# Patient Record
Sex: Female | Born: 1945 | Race: White | Hispanic: No | Marital: Single | Smoking: Never smoker
Health system: Southern US, Community
[De-identification: ages and names within clinical notes are randomized; demographics above are authoritative.]

## PROBLEM LIST (undated history)

## (undated) DIAGNOSIS — F32A Depression, unspecified: Secondary | ICD-10-CM

## (undated) DIAGNOSIS — IMO0002 Reserved for concepts with insufficient information to code with codable children: Secondary | ICD-10-CM

## (undated) DIAGNOSIS — F419 Anxiety disorder, unspecified: Secondary | ICD-10-CM

## (undated) DIAGNOSIS — M1990 Unspecified osteoarthritis, unspecified site: Secondary | ICD-10-CM

## (undated) DIAGNOSIS — G4700 Insomnia, unspecified: Secondary | ICD-10-CM

## (undated) DIAGNOSIS — C7A8 Other malignant neuroendocrine tumors: Secondary | ICD-10-CM

## (undated) HISTORY — PX: TONSILLECTOMY: SUR1361

## (undated) HISTORY — DX: Reserved for concepts with insufficient information to code with codable children: IMO0002

## (undated) HISTORY — PX: WISDOM TOOTH EXTRACTION: SHX21

## (undated) HISTORY — DX: Insomnia, unspecified: G47.00

## (undated) HISTORY — DX: Other malignant neuroendocrine tumors: C7A.8

## (undated) HISTORY — DX: Unspecified osteoarthritis, unspecified site: M19.90

## (undated) HISTORY — PX: TUBAL LIGATION: SHX77

## (undated) HISTORY — PX: KNEE SURGERY: SHX244

## (undated) HISTORY — PX: APPENDECTOMY: SHX54

---

## 1999-06-21 ENCOUNTER — Other Ambulatory Visit: Admission: RE | Admit: 1999-06-21 | Discharge: 1999-06-21 | Payer: Self-pay | Admitting: Family Medicine

## 2001-12-31 ENCOUNTER — Encounter: Admission: RE | Admit: 2001-12-31 | Discharge: 2001-12-31 | Payer: Self-pay | Admitting: *Deleted

## 2004-04-20 ENCOUNTER — Other Ambulatory Visit: Admission: RE | Admit: 2004-04-20 | Discharge: 2004-04-20 | Payer: Self-pay | Admitting: Family Medicine

## 2004-08-12 ENCOUNTER — Ambulatory Visit (HOSPITAL_COMMUNITY): Admission: RE | Admit: 2004-08-12 | Discharge: 2004-08-12 | Payer: Self-pay | Admitting: Specialist

## 2004-12-06 ENCOUNTER — Ambulatory Visit: Payer: Self-pay | Admitting: Family Medicine

## 2005-02-22 ENCOUNTER — Ambulatory Visit: Payer: Self-pay | Admitting: Family Medicine

## 2005-03-29 ENCOUNTER — Ambulatory Visit: Payer: Self-pay | Admitting: Family Medicine

## 2005-04-18 ENCOUNTER — Ambulatory Visit: Payer: Self-pay | Admitting: Family Medicine

## 2005-04-26 ENCOUNTER — Ambulatory Visit: Payer: Self-pay | Admitting: Family Medicine

## 2005-04-26 ENCOUNTER — Other Ambulatory Visit: Admission: RE | Admit: 2005-04-26 | Discharge: 2005-04-26 | Payer: Self-pay | Admitting: Family Medicine

## 2005-05-17 ENCOUNTER — Ambulatory Visit: Payer: Self-pay | Admitting: Family Medicine

## 2005-05-26 ENCOUNTER — Ambulatory Visit: Payer: Self-pay | Admitting: Gastroenterology

## 2005-06-07 ENCOUNTER — Ambulatory Visit: Payer: Self-pay | Admitting: Family Medicine

## 2005-07-19 ENCOUNTER — Ambulatory Visit: Payer: Self-pay | Admitting: Family Medicine

## 2005-08-31 ENCOUNTER — Ambulatory Visit: Payer: Self-pay | Admitting: Family Medicine

## 2005-12-13 ENCOUNTER — Ambulatory Visit: Payer: Self-pay | Admitting: Family Medicine

## 2006-02-01 ENCOUNTER — Ambulatory Visit: Payer: Self-pay | Admitting: Family Medicine

## 2006-03-13 ENCOUNTER — Ambulatory Visit: Payer: Self-pay | Admitting: Family Medicine

## 2006-05-30 ENCOUNTER — Ambulatory Visit: Payer: Self-pay | Admitting: Family Medicine

## 2006-07-23 ENCOUNTER — Ambulatory Visit: Payer: Self-pay | Admitting: Family Medicine

## 2006-09-25 ENCOUNTER — Ambulatory Visit: Payer: Self-pay | Admitting: Family Medicine

## 2007-01-29 ENCOUNTER — Encounter: Admission: RE | Admit: 2007-01-29 | Discharge: 2007-01-29 | Payer: Self-pay | Admitting: Family Medicine

## 2008-03-23 IMAGING — US US EXTREM LOW VENOUS*R*
1 series · 13 of 24 positions shown · non-contrast
Comparison: none

CLINICAL DATA: 61 year old female with right thigh and calf varicose veins, leg pain. 
RIGHT LOWER EXTREMITY DUPLEX VENOUS ULTRASOUND OF THE SUPERFICIAL AND DEEP VENOUS SYSTEMS:
TECHNIQUE: Gray-scale sonography with compression, as well as color and duplex Doppler ultrasound, were performed to evaluate the deep venous system from the level of the common femoral vein through the popliteal and proximal calf veins.

[Series 1: us extrem low venous*right* · 13 of 25 slices shown]
[im 1/25]
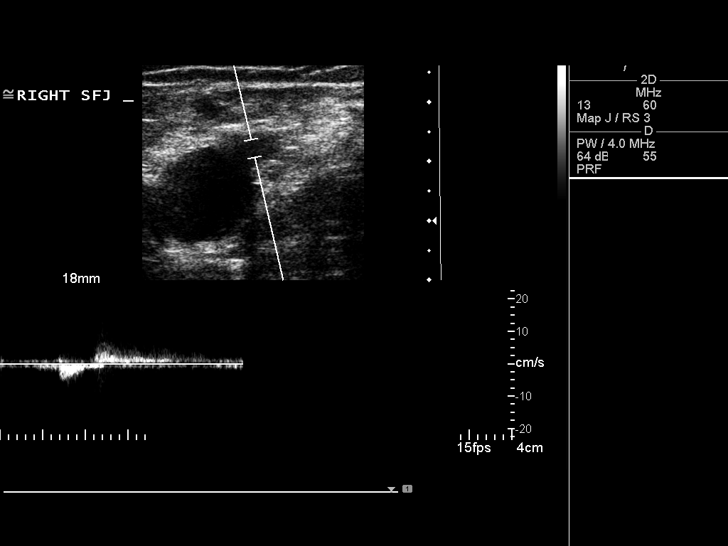
[im 3/25]
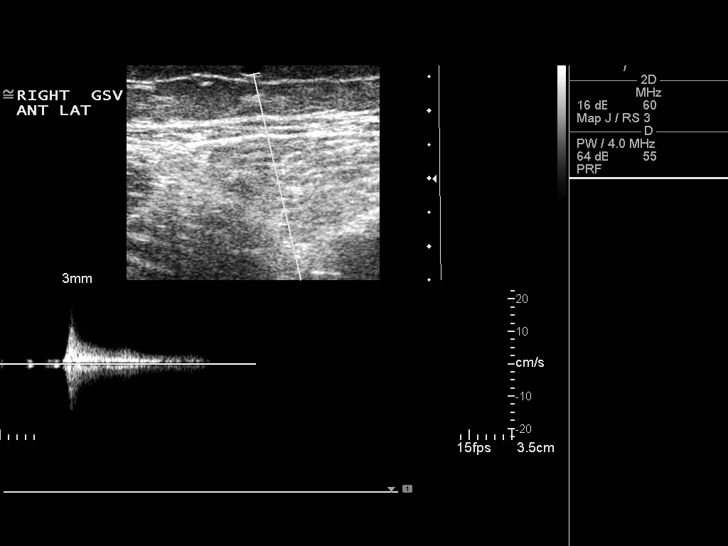
[im 5/25]
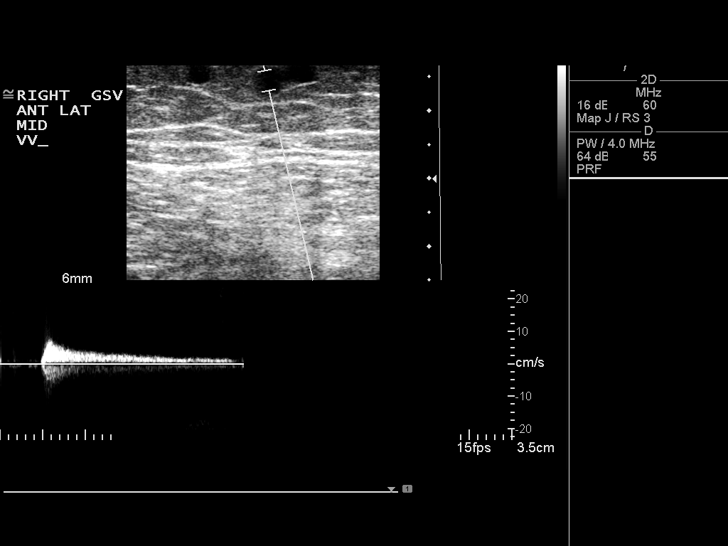
[im 7/25]
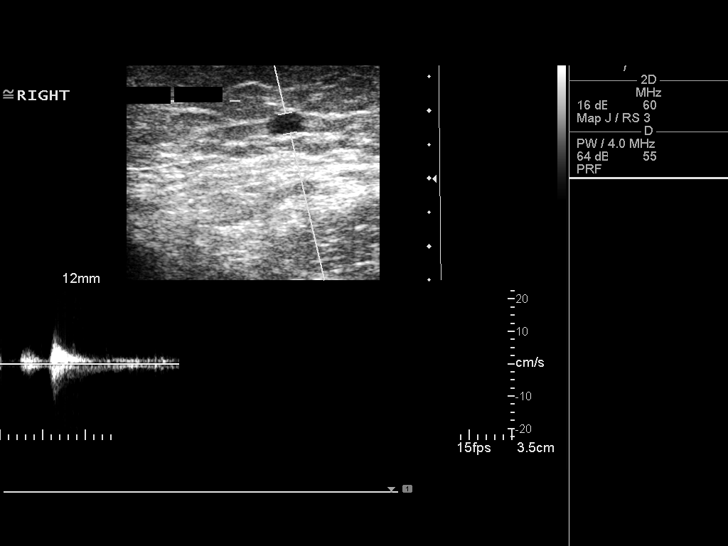
[im 9/25]
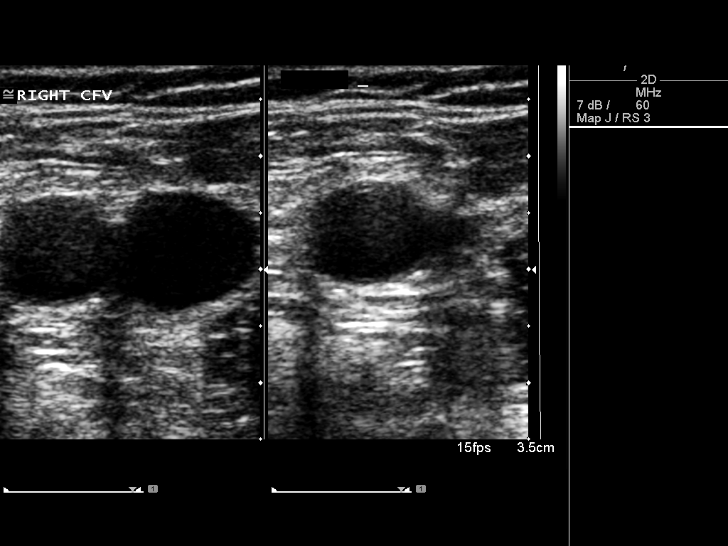
[im 11/25]
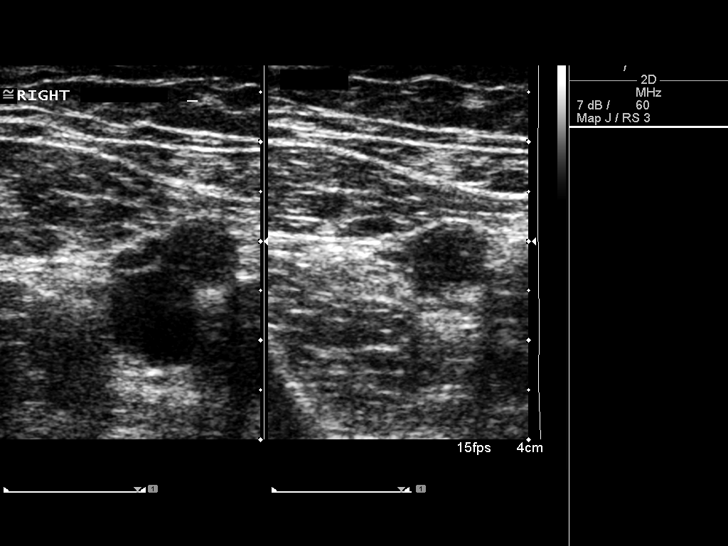
[im 13/25]
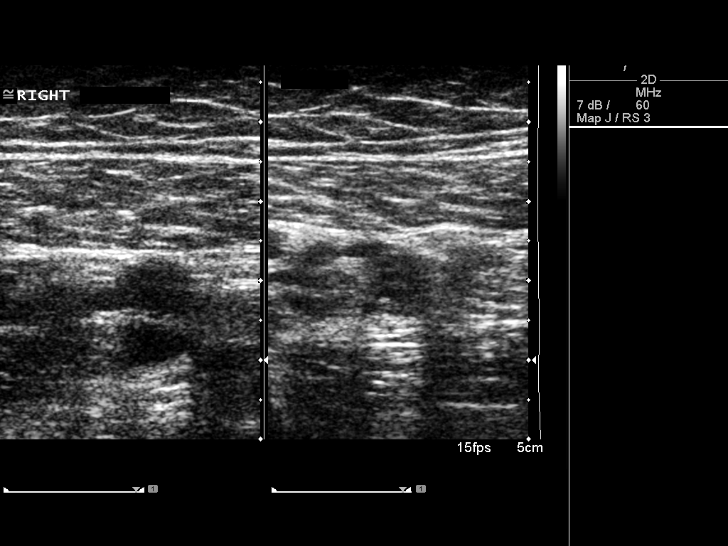
[im 14/25]
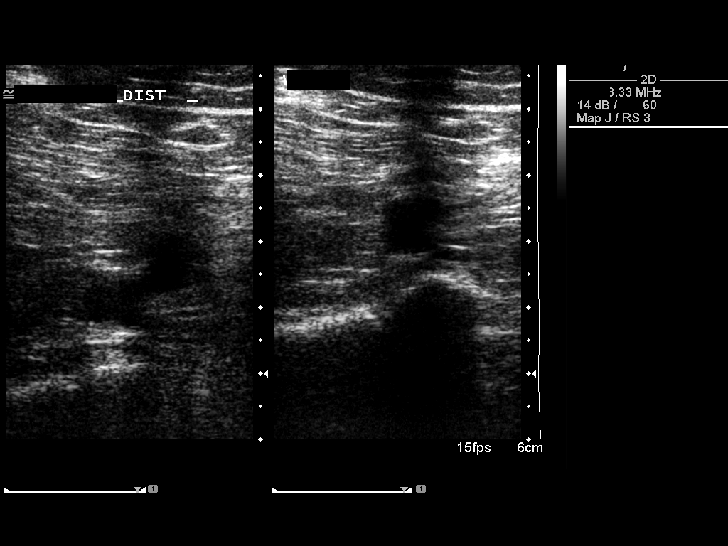
[im 16/25]
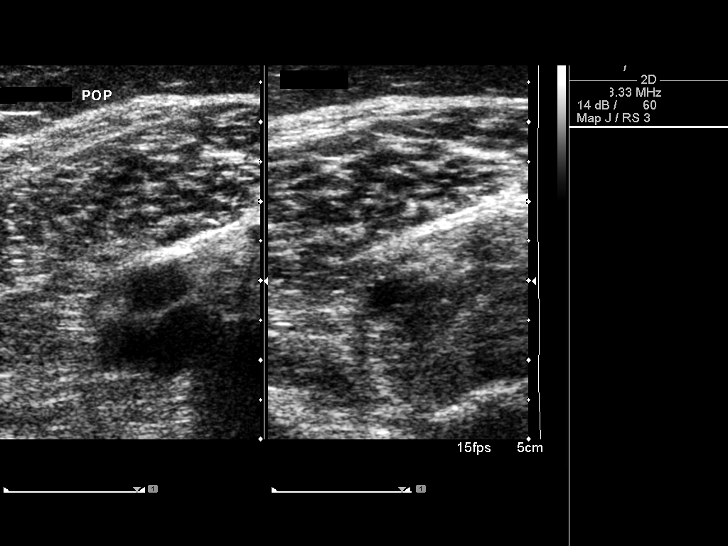
[im 18/25]
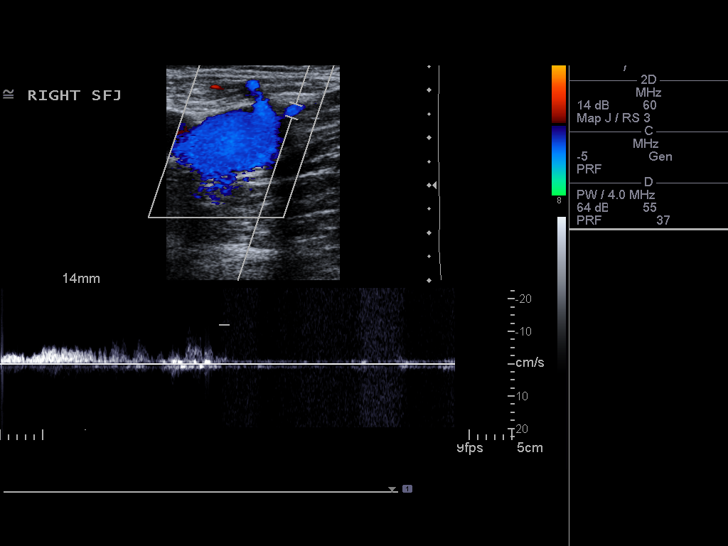
[im 20/25]
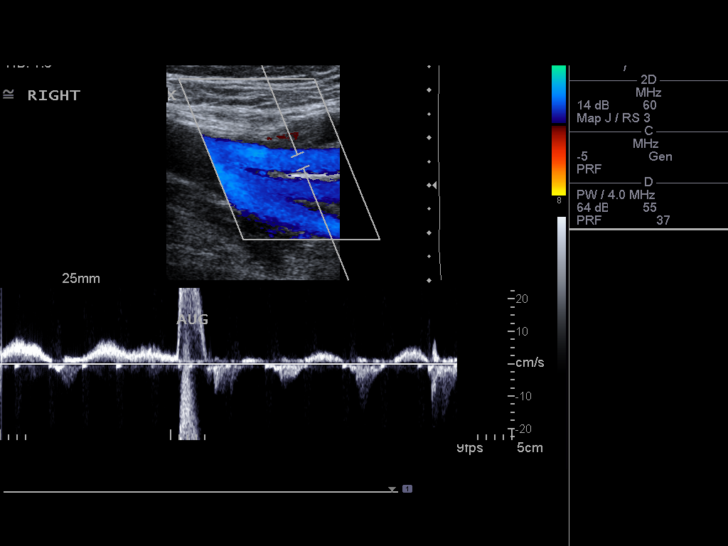
[im 22/25]
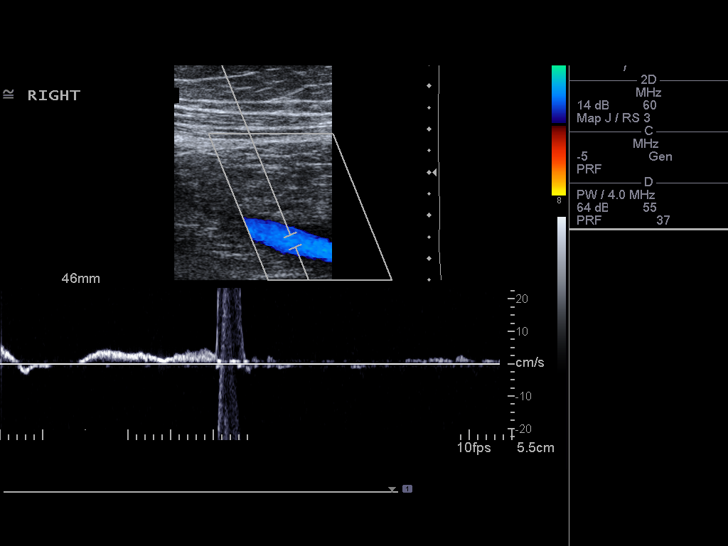
[im 25/25]
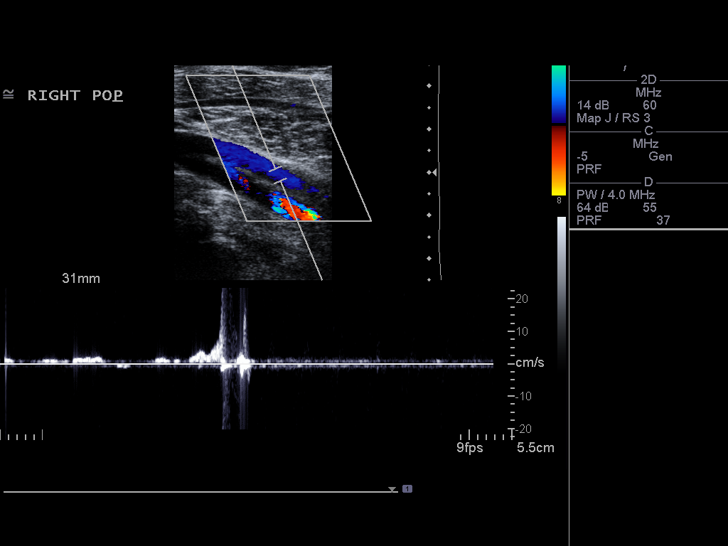

[13 of 24 positions shown; findings below may reference images not displayed]

FINDINGS: The right GSV is duplicated proximally with an anterior lateral tributary branch.  This anterior lateral GSV is dilated with venous insufficiency in the proximal thigh for approximately 15cm.  In the mid thigh there is a dominant thigh varicosity supplied by the anterior lateral GSV which is very tortuous and eventually communicates with the right main GSV distally above the knee.  GSV venous insufficiency is noted across the knee with decompression into the calf varicosities.  This is the patient?s dominant refluxing pathway and results in the thigh and calf varicose veins.  The right common femoral, femoral, and popliteal veins demonstrate normal compressibility, phasicity, and augmentation without DVT.
IMPRESSION: 1.  Right anterior lateral GSV and main GSV venous insufficiency resulting in the thigh and calf varicosities.  The pattern of disease is amenable to endovenous laser treatment. 
2.  No evidence of DVT.

## 2008-06-12 ENCOUNTER — Ambulatory Visit: Payer: Self-pay | Admitting: Family Medicine

## 2008-06-12 DIAGNOSIS — G4700 Insomnia, unspecified: Secondary | ICD-10-CM

## 2008-06-12 DIAGNOSIS — M1990 Unspecified osteoarthritis, unspecified site: Secondary | ICD-10-CM | POA: Insufficient documentation

## 2009-01-28 ENCOUNTER — Ambulatory Visit: Payer: Self-pay | Admitting: Family Medicine

## 2009-01-28 DIAGNOSIS — J309 Allergic rhinitis, unspecified: Secondary | ICD-10-CM | POA: Insufficient documentation

## 2009-10-21 ENCOUNTER — Ambulatory Visit: Payer: Self-pay | Admitting: Family Medicine

## 2010-03-24 ENCOUNTER — Ambulatory Visit: Payer: Self-pay | Admitting: Family Medicine

## 2010-03-24 DIAGNOSIS — N959 Unspecified menopausal and perimenopausal disorder: Secondary | ICD-10-CM | POA: Insufficient documentation

## 2010-03-29 ENCOUNTER — Encounter: Payer: Self-pay | Admitting: Family Medicine

## 2010-03-30 ENCOUNTER — Encounter: Payer: Self-pay | Admitting: Family Medicine

## 2010-04-04 LAB — CONVERTED CEMR LAB: FSH: 50.3 milliintl units/mL

## 2010-04-05 ENCOUNTER — Telehealth: Payer: Self-pay | Admitting: Family Medicine

## 2010-04-27 ENCOUNTER — Ambulatory Visit: Payer: Self-pay | Admitting: Family Medicine

## 2010-04-27 DIAGNOSIS — R5383 Other fatigue: Secondary | ICD-10-CM

## 2010-04-27 DIAGNOSIS — F411 Generalized anxiety disorder: Secondary | ICD-10-CM | POA: Insufficient documentation

## 2010-04-27 DIAGNOSIS — R5381 Other malaise: Secondary | ICD-10-CM | POA: Insufficient documentation

## 2010-04-27 DIAGNOSIS — R635 Abnormal weight gain: Secondary | ICD-10-CM | POA: Insufficient documentation

## 2010-04-28 ENCOUNTER — Ambulatory Visit: Payer: Self-pay | Admitting: Family Medicine

## 2010-05-02 LAB — CONVERTED CEMR LAB
BUN: 20 mg/dL (ref 6–23)
Basophils Absolute: 0 10*3/uL (ref 0.0–0.1)
Basophils Relative: 0.8 % (ref 0.0–3.0)
CO2: 28 meq/L (ref 19–32)
Calcium: 9.1 mg/dL (ref 8.4–10.5)
Chloride: 106 meq/L (ref 96–112)
Cholesterol: 206 mg/dL — ABNORMAL HIGH (ref 0–200)
Creatinine, Ser: 0.8 mg/dL (ref 0.4–1.2)
Direct LDL: 122.6 mg/dL
Eosinophils Absolute: 0.1 10*3/uL (ref 0.0–0.7)
Eosinophils Relative: 1.5 % (ref 0.0–5.0)
GFR calc non Af Amer: 72.95 mL/min (ref >60)
Glucose, Bld: 93 mg/dL (ref 70–99)
HCT: 41.3 % (ref 36.0–46.0)
HDL: 64.6 mg/dL (ref >39.00)
Hemoglobin: 14.2 g/dL (ref 12.0–15.0)
Lymphocytes Relative: 27.6 % (ref 12.0–46.0)
Lymphs Abs: 1.6 10*3/uL (ref 0.7–4.0)
MCHC: 34.4 g/dL (ref 30.0–36.0)
MCV: 95.7 fL (ref 78.0–100.0)
Monocytes Absolute: 0.4 10*3/uL (ref 0.1–1.0)
Monocytes Relative: 6.5 % (ref 3.0–12.0)
Neutro Abs: 3.7 10*3/uL (ref 1.4–7.7)
Neutrophils Relative %: 63.6 % (ref 43.0–77.0)
Platelets: 159 10*3/uL (ref 150.0–400.0)
Potassium: 5.3 meq/L — ABNORMAL HIGH (ref 3.5–5.1)
RBC: 4.31 M/uL (ref 3.87–5.11)
RDW: 13.5 % (ref 11.5–14.6)
Sodium: 140 meq/L (ref 135–145)
TSH: 2.49 microintl units/mL (ref 0.35–5.50)
Total CHOL/HDL Ratio: 3
Triglycerides: 101 mg/dL (ref 0.0–149.0)
VLDL: 20.2 mg/dL (ref 0.0–40.0)
WBC: 5.9 10*3/uL (ref 4.5–10.5)

## 2010-05-03 ENCOUNTER — Telehealth: Payer: Self-pay | Admitting: Family Medicine

## 2010-06-16 ENCOUNTER — Ambulatory Visit: Payer: Self-pay | Admitting: Family Medicine

## 2010-10-18 ENCOUNTER — Ambulatory Visit: Payer: Self-pay | Admitting: Internal Medicine

## 2010-11-29 NOTE — Progress Notes (Signed)
Summary: ?alprazolam   Phone Note Call from Patient   Caller: Patient Summary of Call: took 1/2 alprazolam and slept all day thinks may need something  else pls advise. Initial call taken by: Pura Spice, RN,  May 03, 2010 9:45 AM  Follow-up for Phone Call        per dr York Spaniel cut alpzolasm tablets into fourths and try justtaking quarterv of tablet .  Follow-up by: Pura Spice, RN,  May 03, 2010 1:56 PM  Additional Follow-up for Phone Call Additional follow up Details #1::        pt notified.  Additional Follow-up by: Pura Spice, RN,  May 03, 2010 2:29 PM

## 2010-11-29 NOTE — Medication Information (Signed)
Summary: Coverage Approval for [employer]  Coverage Approval for [employer]   Imported By: Maryln Gottron 04/14/2010 14:11:39  _____________________________________________________________________  External Attachment:    Type:   Image     Comment:   External Document

## 2010-11-29 NOTE — Assessment & Plan Note (Signed)
Summary: med check/discuss menopause med/cjr   Vital Signs:  Patient profile:   65 year old female Weight:      178 pounds BMI:     26.38 Temp:     98.6 degrees F Pulse rate:   67 / minute Pulse rhythm:   regular BP sitting:   124 / 72  (left arm)  Vitals Entered By: Pura Spice, RN (April 27, 2010 2:57 PM) CC: stressed  hot flashes better.    History of Present Illness: This 65 year old single white female postmenopausal who is been having hot flashes but since started on estradiol has been doing much better he continues to have occasional hot flash. Her main complaint is she had been first rest and dislikes her job and his own and take an alprazolam at bedtime to discuss taking it 3 times a day a lower dosage She complains of fatigue and also weight gain normal unable to lose We have discussed having lab studies to further evaluate her 3 joints are well controlled with Arthrotec  Allergies: 1)  ! Penicillin G Sodium (Penicillin G Sodium) 2)  ! Compazine  Past History:  Past Medical History: Last updated: 06/12/2008 Osteoarthritis degenerative disc disease insomnia  Review of Systems      See HPI  The patient denies anorexia, fever, weight loss, weight gain, vision loss, decreased hearing, hoarseness, chest pain, syncope, dyspnea on exertion, peripheral edema, prolonged cough, headaches, hemoptysis, abdominal pain, melena, hematochezia, severe indigestion/heartburn, hematuria, incontinence, genital sores, muscle weakness, suspicious skin lesions, transient blindness, difficulty walking, depression, unusual weight change, abnormal bleeding, enlarged lymph nodes, angioedema, breast masses, and testicular masses.    Physical Exam  General:  Well-developed,well-nourished,in no acute distress; alert,appropriate and cooperative throughout examination Head:  Normocephalic and atraumatic without obvious abnormalities. No apparent alopecia or balding. Eyes:  No corneal or  conjunctival inflammation noted. EOMI. Perrla. Funduscopic exam benign, without hemorrhages, exudates or papilledema. Vision grossly normal. Ears:  External ear exam shows no significant lesions or deformities.  Otoscopic examination reveals clear canals, tympanic membranes are intact bilaterally without bulging, retraction, inflammation or discharge. Hearing is grossly normal bilaterally. Nose:  External nasal examination shows no deformity or inflammation. Nasal mucosa are pink and moist without lesions or exudates. Mouth:  Oral mucosa and oropharynx without lesions or exudates.  Teeth in good repair. Lungs:  Normal respiratory effort, chest expands symmetrically. Lungs are clear to auscultation, no crackles or wheezes. Heart:  Normal rate and regular rhythm. S1 and S2 normal without gallop, murmur, click, rub or other extra sounds. Abdomen:  Bowel sounds positive,abdomen soft and non-tender without masses, organomegaly or hernias noted. Rectal:  not examined Genitalia:  none exam Msk:  No deformity or scoliosis noted of thoracic or lumbar spine.   Extremities:  No clubbing, cyanosis, edema, or deformity noted with normal full range of motion of all joints.     Impression & Recommendations:  Problem # 1:  ANXIETY (ICD-300.00) Assessment New  The following medications were removed from the medication list:    Alprazolam 1 Mg Tabs (Alprazolam) .Marland Kitchen... 1 hs as needed sleep, increase to 2 prn Her updated medication list for this problem includes:    Alprazolam 1 Mg Tabs (Alprazolam) .Marland Kitchen... 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 1qd for menopausal syndrome  Problem # 5:  INSOMNIA (ICD-780.52) Assessment: Improved  The following medications were removed from the medication list:    Lunesta 3 Mg Tabs (Eszopiclone) .Marland Kitchen... 1 hs for sleep alprazolam 2 mg at bedtime has helped  Problem # 6:  OSTEOARTHRITIS (ICD-715.90) Assessment: Improved  Her updated medication list for this problem includes:    Arthrotec 75 75-200 Mg-mcg Tabs (Diclofenac-misoprostol) .Marland Kitchen... 1 two times a day for inflammation  Complete Medication List: 1)  Arthrotec 75 75-200 Mg-mcg Tabs (Diclofenac-misoprostol) .Marland Kitchen.. 1 two times a day for inflammation 2)  Valtrex 1 Gm Tabs (Valacyclovir hcl) .... 2 tab stat then repeat in 12 hrs then repeat in 2 weeks 3)  Estradiol 0.5 Mg Tabs (Estradiol) .Marland Kitchen.. 1qd for menopausal syndrome 4)  Alprazolam 1 Mg Tabs (Alprazolam) .Marland Kitchen.. 1 morn midafternoon and hs for anxiety and depression  Patient Instructions: 1)  anxiety and depression and fatique 2)  take alprazolam three times a day 3)  Schedule Labs on 30 June Prescriptions: ALPRAZOLAM 1 MG TABS (ALPRAZOLAM) 1 morn midafternoon and hs for anxiety and depression  # 90 x 5   Entered and Authorized by:   Judithann Sheen MD   Signed by:   Judithann Sheen MD on 04/27/2010   Method used:   Print then Give to Patient   RxID:   [id number]

## 2010-11-29 NOTE — Assessment & Plan Note (Signed)
Summary: CONSULT ABOUT ARTHRITIS // RS   Vital Signs:  Patient profile:   65 year old female Height:      69 inches Weight:      175 pounds O2 Sat:      97 % Temp:     98.5 degrees F Pulse rate:   72 / minute BP sitting:   130 / 84  (left arm)  Vitals Entered By: Pura Spice, RN (Mar 24, 2010 4:13 PM) CC: not sleeping lunesta not helping    History of Present Illness: This 65 year old white single female is in today complaining of inability to sleep, as well as having hot flashes haven't had her last menstruation several years ago and is not on any hormone Complaint of being depressed and not able to lose weight She works out 3-4 times per week for 30-40 minutes and deferred muscular and we have discussed that 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 Penicillin G Sodium (Penicillin G Sodium) 2)  ! Compazine  Past History:  Past Medical History: Last updated: 06/12/2008 Osteoarthritis degenerative disc disease insomnia  Review of Systems      See HPI  The patient denies anorexia, fever, weight loss, weight gain, vision loss, decreased hearing, hoarseness, chest pain, syncope, dyspnea on exertion, peripheral edema, prolonged cough, headaches, hemoptysis, abdominal pain, melena, hematochezia, severe indigestion/heartburn, hematuria, incontinence, genital sores, muscle weakness, suspicious skin lesions, transient blindness, difficulty walking, depression, unusual weight change, abnormal bleeding, enlarged lymph nodes, angioedema, breast masses, and testicular masses.    Physical Exam  General:  Well-developed,well-nourished,in no acute distress; alert,appropriate and cooperative throughout examination Lungs:  Normal respiratory effort, chest expands  symmetrically. Lungs are clear to auscultation, no crackles or wheezes. Heart:  Normal rate and regular rhythm. S1 and S2 normal without gallop, murmur, click, rub or other extra sounds. Abdomen:  Bowel sounds positive,abdomen soft and non-tender without masses, organomegaly or hernias noted. Rectal:  not examined Genitalia:  not examined Msk:  No deformity or scoliosis noted of thoracic or lumbar spine.   Extremities:  No clubbing, cyanosis, edema, or deformity noted with normal full range of motion of all joints.     Impression & Recommendations:  Problem # 1:  POSTMENOPAUSAL SYNDROME (ICD-627.9) Assessment New  Orders: TLB-FSH (Follicle Stimulating Hormone) (83001-FSH) after getting FSH results will discuss treatment  Problem # 2:  INSOMNIA (ICD-780.52) Assessment: Unchanged  Her updated medication list for this problem includes:    Lunesta 3 Mg Tabs (Eszopiclone) .Marland Kitchen... 1 hs for sleep  Problem # 3:  OSTEOARTHRITIS (ICD-715.90) Assessment: Deteriorated  The following medications were removed from the medication list:    Tramadol Hcl 50 Mg Tabs (Tramadol hcl) .Marland Kitchen... 1-2 q4h as needed cough or pain Her updated medication list for this problem includes:    Arthrotec 75 75-200 Mg-mcg Tabs (Diclofenac-misoprostol) .Marland Kitchen... 1 two times a day for inflammation  Complete Medication List: 1)  Lunesta 3 Mg Tabs (Eszopiclone) .Marland Kitchen.. 1 hs for sleep 2)  Arthrotec 75 75-200 Mg-mcg Tabs (Diclofenac-misoprostol) .Marland Kitchen.. 1 two times a day for inflammation 3)  Valtrex 1 Gm Tabs (Valacyclovir hcl) .... 2 tab stat then repeat in 12 hrs then repeat in 2 weeks 4)  Maxiflu Dm 60-20-400-500 Mg Tabs (Pseudoephedrine-dm-gg-apap) .Marland Kitchen.. 1 two times  a day for congestion and drainage 5)  Alprazolam 1 Mg Tabs (Alprazolam) .Marland Kitchen.. 1 hs as needed sleep, increase to 2 prn  Patient Instructions: 1)  continue taking Arthrotec for arthritis and also prescribed tramadol that she can utilize for pain 2)  After we receive the  [facility] report we will discuss postmenopausal treatment 3)  I don't think you have a problem with being overweight ovary muscular due to your regular exercise program 4)  Lunesta for your insomnia 5)  Return your regular time for physical examination Prescriptions: TRAMADOL HCL 50 MG TABS (TRAMADOL HCL) 1-2 q4h as needed cough or pain  #50 x 1   Entered and Authorized by:   Judithann Sheen MD   Signed by:   Judithann Sheen MD on 03/24/2010   Method used:   Electronically to        [facility] [phone]* (retail)       [address]       [place], [place]  [address]       Ph: [phone] or [phone]       Fax: [phone]   RxID:   [id number] LEVAQUIN 750 MG TABS (LEVOFLOXACIN) 1 once daily until finished for infection  #7 x 0   Entered and Authorized by:   Judithann Sheen MD   Signed by:   Judithann Sheen MD on 03/24/2010   Method used:   [facility]ronically to        [id number] (retail[facility]       [address]       [place], [place]  [address]       Ph: [phone] or [phone]       Fax: [phone]   RxID:   [id number] PROCHLORPERAZINE MALEATE 10 MG TABS (PROCHLORPERAZINE MALEATE) 1 q6h as needed to prevent nausea and vomiting  #36 x 2   Entered and Authorized by:   Judithann Sheen MD   Signed by:   Judithann Sheen MD on 03/24/2010   Method used:   Print then Give to Patient   RxID:   [id number] LUNESTA 3 MG TABS (ESZOPICLONE) 1 hs for sleep  #30 x 5   Entered and Authorized by:   Judithann Sheen MD   Signed by:   Judithann Sheen MD on 03/24/2010   Method used:   Print then Give to Patient   RxID:   [id number] ALPRAZOLAM 1 MG TABS (ALPRAZOLAM) 1 hs as needed sleep, increase to 2 prn  #60 x 5   Entered and Authorized by:   Judithann Sheen MD   Signed by:   Judithann Sheen MD on 03/24/2010   Method used:   Print then Give to Patient   RxID:   [id number]

## 2010-11-29 NOTE — Progress Notes (Signed)
Summary: Pt req lab results. Pls call asap  Phone Note Call from Patient Call back at Home Phone [phone]   Caller: Patient Summary of Call: Pt called req lab results from 03/24/10. Pls call asap.  Initial call taken by: Lucy Antigua,  April 05, 2010 8:49 AM  Follow-up for Phone Call        menopausal syndrome, estradiol .5 mg qd Follow-up by: Judithann Sheen MD,  April 05, 2010 5:50 PM

## 2010-11-29 NOTE — Assessment & Plan Note (Signed)
Summary: fup on meds/ccm pt rsc/njr   Vital Signs:  Patient profile:   65 year old female Weight:      174 pounds O2 Sat:      98 % Temp:     98.2 degrees F Pulse rhythm:   regular BP sitting:   124 / 78  (left arm)  Vitals Entered By: Pura Spice, RN (June 16, 2010 1:03 PM) CC: alprazolam did not help and she stopped arthrotec.    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 Penicillin G Sodium (Penicillin G Sodium) 2)  ! Compazine  Past History:  Past Medical History: Last updated: 06/12/2008 Osteoarthritis degenerative disc disease insomnia  Review of Systems      See HPI  The patient denies anorexia, fever, weight loss, weight gain, vision loss, decreased hearing, hoarseness, chest pain, syncope, dyspnea on exertion, peripheral edema, prolonged cough, headaches, hemoptysis, abdominal pain, melena, hematochezia, severe indigestion/heartburn, hematuria, incontinence, genital sores, muscle weakness, suspicious skin lesions, transient blindness, difficulty walking, depression, unusual weight change, abnormal bleeding, enlarged lymph nodes, angioedema, breast masses, and testicular masses.    Physical Exam  General:  Well-developed,well-nourished,in no acute distress; alert,appropriate and cooperative throughout examination Lungs:  Normal respiratory effort, chest expands symmetrically. Lungs are clear to auscultation, no crackles or wheezes. Heart:  Normal rate and regular rhythm. S1 and S2 normal without gallop, murmur, click, rub or other extra sounds. Msk:  PIP joints are tender and slightly swollen Extremities:  No clubbing, cyanosis,  edema, or deformity noted with normal full range of motion of all joints.     Impression & Recommendations:  Problem # 1:  ANXIETY (ICD-300.00) Assessment Deteriorated  The following medications were removed from the medication list:    Alprazolam 1 Mg Tabs (Alprazolam) .Marland Kitchen... 1 morn midafternoon and hs for anxiety and depression Her updated medication list for this problem includes:    Diazepam 5 Mg Tabs (Diazepam) .Marland Kitchen... 1 morn midafternoon and hs for stress and anxiety  Problem # 2:  POSTMENOPAUSAL SYNDROME (ICD-627.9) Assessment: Improved  Her updated medication list for this problem includes:    Estradiol 0.5 Mg Tabs (Estradiol) .Marland Kitchen... 1qd for menopausal syndrome  Problem # 3:  Preventive Health Care (ICD-V70.0) Assessment: Deteriorated restart Arthrotec one b.i.d. p.c.  Complete Medication List: 1)  Arthrotec 75 75-200 Mg-mcg Tabs (Diclofenac-misoprostol) .Marland Kitchen.. 1 two times a day for inflammation 2)  Valtrex 1 Gm Tabs (Valacyclovir hcl) .... 2 tab stat then repeat in 12 hrs then repeat in 2 weeks 3)  Estradiol 0.5 Mg Tabs (Estradiol) .Marland Kitchen.. 1qd for menopausal syndrome 4)  Diazepam 5 Mg Tabs (Diazepam) .Marland Kitchen.. 1 morn midafternoon and hs for stress and anxiety  Patient Instructions: 1)  would change alprazolam to diazepam 5 mg t.i.d. 2)  Restart Arthrotec 3)  Continue estradiol Prescriptions: DIAZEPAM 5 MG TABS (DIAZEPAM) 1 morn midafternoon and hs for stress and anxiety  #90 x 5   Entered and Authorized by:   Judithann Sheen MD   Signed by:   Judithann Sheen MD on 06/16/2010   Method used:   Print then Give to Patient   RxID:   [id number]

## 2010-11-29 NOTE — Medication Information (Signed)
Summary: Coverage Approval for [employer]  Coverage Approval for [employer]   Imported By: Maryln Gottron 04/01/2010 09:58:08  _____________________________________________________________________  External Attachment:    Type:   Image     Comment:   External Document

## 2011-04-18 ENCOUNTER — Encounter: Payer: Self-pay | Admitting: Family Medicine

## 2011-04-18 ENCOUNTER — Ambulatory Visit (INDEPENDENT_AMBULATORY_CARE_PROVIDER_SITE_OTHER): Payer: BC Managed Care – PPO | Admitting: Family Medicine

## 2011-04-18 DIAGNOSIS — G4700 Insomnia, unspecified: Secondary | ICD-10-CM

## 2011-04-18 DIAGNOSIS — J309 Allergic rhinitis, unspecified: Secondary | ICD-10-CM

## 2011-04-18 DIAGNOSIS — B009 Herpesviral infection, unspecified: Secondary | ICD-10-CM

## 2011-04-18 DIAGNOSIS — Z780 Asymptomatic menopausal state: Secondary | ICD-10-CM

## 2011-04-18 DIAGNOSIS — N951 Menopausal and female climacteric states: Secondary | ICD-10-CM

## 2011-04-18 MED ORDER — VALACYCLOVIR HCL 1 G PO TABS: ORAL_TABLET | ORAL | Status: AC

## 2011-04-18 MED ORDER — BECLOMETHASONE DIPROPIONATE 80 MCG/ACT NA AERS: 2.0000 | INHALATION_SPRAY | NASAL | Status: DC

## 2011-04-18 MED ORDER — PREDNISONE 10 MG PO TABS: ORAL_TABLET | ORAL | Status: DC

## 2011-04-18 NOTE — Patient Instructions (Signed)
Allergic  rhinitis, use qnasl 2 sprays in each nostril each day Take prednisone as prescribed, repeat if reoccurs Schedule physical  examination

## 2011-04-27 ENCOUNTER — Telehealth: Payer: Self-pay

## 2011-04-27 ENCOUNTER — Other Ambulatory Visit: Payer: Self-pay

## 2011-04-27 MED ORDER — MOMETASONE FUROATE 50 MCG/ACT NA SUSP: 2.0000 | Freq: Every day | NASAL | Status: DC

## 2011-04-27 NOTE — Telephone Encounter (Signed)
Per Dr. Scotty Court pt is to switch from [facility] to nasonex.

## 2011-04-27 NOTE — Telephone Encounter (Signed)
Pt called and stated she would like Dr. Charmian Muff nurse to call her; pt called back and a message was left to return call

## 2011-04-28 NOTE — Telephone Encounter (Signed)
Pt called and stated she wanted to make sure that her labs included checking to make sure she is done with menopause and included a thyroid check.

## 2011-05-02 ENCOUNTER — Other Ambulatory Visit (INDEPENDENT_AMBULATORY_CARE_PROVIDER_SITE_OTHER): Payer: BC Managed Care – PPO

## 2011-05-02 DIAGNOSIS — Z0000 Encounter for general adult medical examination without abnormal findings: Secondary | ICD-10-CM

## 2011-05-02 LAB — CBC WITH DIFFERENTIAL/PLATELET
Basophils Relative: 0.4 % (ref 0.0–3.0)
Eosinophils Absolute: 0 10*3/uL (ref 0.0–0.7)
Eosinophils Relative: 0 % (ref 0.0–5.0)
Lymphocytes Relative: 15.7 % (ref 12.0–46.0)
Monocytes Absolute: 0.7 10*3/uL (ref 0.1–1.0)
Neutrophils Relative %: 78.7 % — ABNORMAL HIGH (ref 43.0–77.0)
Platelets: 157 10*3/uL (ref 150.0–400.0)
RBC: 4.56 Mil/uL (ref 3.87–5.11)
WBC: 12.9 10*3/uL — ABNORMAL HIGH (ref 4.5–10.5)

## 2011-05-02 LAB — POCT URINALYSIS DIPSTICK
Bilirubin, UA: NEGATIVE
Ketones, UA: NEGATIVE
Leukocytes, UA: NEGATIVE
Protein, UA: NEGATIVE
Spec Grav, UA: 1.02

## 2011-05-02 LAB — LIPID PANEL
Cholesterol: 193 mg/dL (ref 0–200)
HDL: 65 mg/dL (ref >39.00)
LDL Cholesterol: 103 mg/dL — ABNORMAL HIGH (ref 0–99)
Total CHOL/HDL Ratio: 3
Triglycerides: 123 mg/dL (ref 0.0–149.0)

## 2011-05-02 LAB — BASIC METABOLIC PANEL
BUN: 20 mg/dL (ref 6–23)
Calcium: 9.1 mg/dL (ref 8.4–10.5)
Creatinine, Ser: 0.8 mg/dL (ref 0.4–1.2)

## 2011-05-02 LAB — HEPATIC FUNCTION PANEL
AST: 23 U/L (ref 0–37)
Albumin: 4.1 g/dL (ref 3.5–5.2)
Alkaline Phosphatase: 64 U/L (ref 39–117)
Total Protein: 6.7 g/dL (ref 6.0–8.3)

## 2011-05-02 LAB — TSH: TSH: 0.99 u[IU]/mL (ref 0.35–5.50)

## 2011-05-16 ENCOUNTER — Other Ambulatory Visit (HOSPITAL_COMMUNITY)
Admission: RE | Admit: 2011-05-16 | Discharge: 2011-05-16 | Disposition: A | Discharge status: Home / Self Care | Payer: BC Managed Care – PPO | Source: Ambulatory Visit | Attending: Family Medicine | Admitting: Family Medicine

## 2011-05-16 ENCOUNTER — Ambulatory Visit (INDEPENDENT_AMBULATORY_CARE_PROVIDER_SITE_OTHER): Payer: BC Managed Care – PPO | Admitting: Family Medicine

## 2011-05-16 ENCOUNTER — Encounter: Payer: Self-pay | Admitting: Family Medicine

## 2011-05-16 ENCOUNTER — Telehealth: Payer: Self-pay | Admitting: Family Medicine

## 2011-05-16 VITALS — BP 124/74 | HR 63 | Temp 98.6°F | Ht 69.0 in | Wt 172.0 lb

## 2011-05-16 DIAGNOSIS — M1990 Unspecified osteoarthritis, unspecified site: Secondary | ICD-10-CM

## 2011-05-16 DIAGNOSIS — Z01419 Encounter for gynecological examination (general) (routine) without abnormal findings: Secondary | ICD-10-CM | POA: Insufficient documentation

## 2011-05-16 DIAGNOSIS — Z23 Encounter for immunization: Secondary | ICD-10-CM

## 2011-05-16 DIAGNOSIS — Z780 Asymptomatic menopausal state: Secondary | ICD-10-CM

## 2011-05-16 DIAGNOSIS — Z0000 Encounter for general adult medical examination without abnormal findings: Secondary | ICD-10-CM

## 2011-05-16 DIAGNOSIS — J309 Allergic rhinitis, unspecified: Secondary | ICD-10-CM

## 2011-05-16 DIAGNOSIS — N951 Menopausal and female climacteric states: Secondary | ICD-10-CM

## 2011-05-16 DIAGNOSIS — M129 Arthropathy, unspecified: Secondary | ICD-10-CM

## 2011-05-16 DIAGNOSIS — J029 Acute pharyngitis, unspecified: Secondary | ICD-10-CM

## 2011-05-16 MED ORDER — TRAZODONE 25 MG HALF TABLET: 25.0000 mg | ORAL_TABLET | Freq: Every day | ORAL | Status: DC

## 2011-05-16 MED ORDER — ESTRADIOL 0.5 MG PO TABS: 0.5000 mg | ORAL_TABLET | Freq: Every day | ORAL | Status: DC

## 2011-05-16 MED ORDER — CIPROFLOXACIN HCL 500 MG PO TABS: 500.0000 mg | ORAL_TABLET | Freq: Two times a day (BID) | ORAL | Status: AC

## 2011-05-16 MED ORDER — MOMETASONE FUROATE 50 MCG/ACT NA SUSP: 2.0000 | Freq: Every day | NASAL | Status: DC

## 2011-05-16 MED ORDER — TRAZODONE 25 MG HALF TABLET: 150.0000 mg | ORAL_TABLET | Freq: Every day | ORAL | Status: DC

## 2011-05-16 NOTE — Telephone Encounter (Signed)
Need clarification on Trazadone. The strength is not available and the directions need clarification.

## 2011-05-16 NOTE — Telephone Encounter (Signed)
rx has been changed

## 2011-05-18 MED ORDER — FLUTICASONE PROPIONATE 50 MCG/ACT NA SUSP: 2.0000 | Freq: Every day | NASAL | Status: DC

## 2011-05-18 NOTE — Telephone Encounter (Signed)
Ok per Dr. Scotty Court to switch pt to fluticasone.

## 2011-05-26 NOTE — Progress Notes (Signed)
A letter was mailed to pt.

## 2011-05-26 NOTE — Progress Notes (Signed)
Pt aware.

## 2011-05-28 NOTE — Progress Notes (Signed)
  Subjective:    Patient ID: Anna Cortez, female    DOB: June 07, 1946, 65 y.o.   MRN: [id number] This 65 year old white single female who relates she is continuing to have hot flashes sweating at night spiked a low dose of estradiol which we need to increase. He relates she continues to have knee pain the she doesn't take the Arthrotec she complained of recurrent herpes simplex 1 and needs prescription for valtrex. Her #1 complaint today is that of increased allergic rhinitis with marked nasal congestion postnasal drainage and to the portal bothering her at night causing more problem with insomnia she has used fluticasone but caused some problem and needs a new medication. 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 history of present illness     Objective:   Physical Exam the patient is a well-built well-nourished white female who appears much younger than her given age of 65 she  Heart examination reveals no abnormality normal size regular rhythm  is in no distress HEENT reveals pale boggy nasal mucosa swollen with clear drainage no other abnormalities noted no evidence of herpes simplex at this time Lungs are clear to palpation percussion auscultation no rales no dullness no wheezing Extremities continues to have some tenderness to palpation both knees laterally        Assessment & Plan:  Allergic rhinitis to change nasal steroid spray to Qnasal, also with does not improve greatly to treat with oral prednisone since antihistamines are not working Insomnia to try trazodone at bedtime Postmenopausal syndrome continue estradiol we'll discuss dosage at time of physical examination Arthritis knees continue Arthrotec Herpes simplex want to give new prescription for Valtrex Schedule formal for physical examination

## 2011-05-30 ENCOUNTER — Ambulatory Visit (INDEPENDENT_AMBULATORY_CARE_PROVIDER_SITE_OTHER): Payer: BC Managed Care – PPO | Admitting: Family Medicine

## 2011-05-30 ENCOUNTER — Encounter: Payer: Self-pay | Admitting: Family Medicine

## 2011-05-30 VITALS — BP 138/88 | HR 63 | Temp 98.4°F | Wt 175.0 lb

## 2011-05-30 DIAGNOSIS — R197 Diarrhea, unspecified: Secondary | ICD-10-CM

## 2011-05-30 NOTE — Patient Instructions (Signed)
Enteritis persist drink some gaterade to keep electrolytes under control If the medication does not control the situation call me and we will and something else but rather not at this time because we do not want to become constipated

## 2011-05-31 ENCOUNTER — Telehealth: Payer: Self-pay

## 2011-05-31 MED ORDER — DIPHENOXYLATE-ATROPINE 2.5-0.025 MG PO TABS: ORAL_TABLET | ORAL | Status: DC

## 2011-05-31 MED ORDER — DICYCLOMINE HCL 20 MG PO TABS: ORAL_TABLET | ORAL | Status: DC

## 2011-05-31 NOTE — Telephone Encounter (Signed)
rx sent to pharmacy

## 2011-05-31 NOTE — Telephone Encounter (Signed)
Pt called and stated that she went to the pharmacy and her prescription had not been filled.  Pls advise which prescription needs to be called in

## 2011-06-12 ENCOUNTER — Other Ambulatory Visit: Payer: BC Managed Care – PPO

## 2011-06-12 DIAGNOSIS — Z0000 Encounter for general adult medical examination without abnormal findings: Secondary | ICD-10-CM

## 2011-06-12 DIAGNOSIS — K921 Melena: Secondary | ICD-10-CM

## 2011-06-12 LAB — HEMOCCULT GUIAC POC 1CARD (OFFICE)
Card #2 Fecal Occult Blod, POC: NEGATIVE
Card #3 Fecal Occult Blood, POC: NEGATIVE

## 2011-06-14 NOTE — Progress Notes (Signed)
Letter mailed to pt about neg. hemmocults

## 2011-06-15 ENCOUNTER — Encounter: Payer: Self-pay | Admitting: Family Medicine

## 2011-06-15 NOTE — Progress Notes (Signed)
  Subjective:    Patient ID: Anna Cortez, female    DOB: Mar 03, 1946, 65 y.o.   MRN: [id number] This 65 year old white single female is in for a routine annual physical examination her at her main complaint this time is she has episodes of your will bile or or episodes of diarrhea stools are soft to liquid no mucus no blood  Controlled with Lomotil high occurs rather frequently desirous of a regular medication to control this she has no upper abdominal. Patient continues to have arthritic pain and takes Arthrotec when needed HPI    Review of Systems  Constitutional: Negative.   HENT: Positive for congestion.   Eyes: Negative.   Respiratory: Negative.   Cardiovascular: Negative.   Gastrointestinal: Positive for diarrhea.       Irritable bowel  Genitourinary: Negative.   Musculoskeletal: Positive for arthralgias.  Skin: Negative.   Hematological: Negative.   Psychiatric/Behavioral: Negative.        Objective:   Physical Exam as the patient is a well-built well-nourished female who does not appear to be her given age of 52 and is in no distress pleasant and cooperative and alert HEENT reveals nasal congestion pale boggy mucosa some clear drainage no other positive findings carotid pulses are good thyroid is nonpalpable Lungs clear to palpation percussion and auscultation no rales no dullness no wheezing Heart no evidence of cardiomegaly heart sounds are good no murmurs regular rhythm peripheral pulses are good neck of the lower Breasts no masses palpable and no lumps no tenderness excellent clear Abdomen liver spleen kidneys are nonpalpable no masses felt no tenderness bowel sounds normal  Pelvic contamination reveals normal external in shortness vaginal mucosa slightly dry but normal otherwise cervix is clear Pap smear done Rectal examination reveals no positive findings adnexal clear Extremities negative to examination Skin no positive findings no nevi Neurological no positive  findings reflexes are normal equal bilaterally 2+          Assessment & Plan:  Physical examination reveals a healthy female with no major problems Arthritis controlled with Arthrotec Irritable bowel syndrome to start ental 20 mg 3 times a day a.c. 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

## 2011-06-15 NOTE — Patient Instructions (Signed)
Physical examination and results is that of a normal healthy female with a few medical problems For your irritable bowel syndrome start the dicyclomine 20 mg 3 times a day before meal Arthritis continue to take Arthrotec as you need it For insomnia take trazodone as prescribed 4 postmenopausal syndrome continue Estrace oh 0.5 mg daily

## 2011-06-20 ENCOUNTER — Telehealth: Payer: Self-pay

## 2011-06-20 NOTE — Telephone Encounter (Signed)
Pt called stating that she has a rash and had been nausea since last Thursday.  Pt states the nausea is gone but the rash still remains.  Pt states she is at work and cannot leave at the time but would like to know what she needs to do.   Per Dr. Scotty Court pt instructed to stop both new medications and pt instructed to take benadryl 25 mg: one immediately, then 1 am and 1 hs.  Pt aware and will call to report how she is feeling.

## 2011-12-15 ENCOUNTER — Ambulatory Visit: Payer: BC Managed Care – PPO | Admitting: Internal Medicine

## 2011-12-22 ENCOUNTER — Encounter: Payer: Self-pay | Admitting: Internal Medicine

## 2011-12-22 ENCOUNTER — Ambulatory Visit (INDEPENDENT_AMBULATORY_CARE_PROVIDER_SITE_OTHER): Payer: BC Managed Care – PPO | Admitting: Internal Medicine

## 2011-12-22 DIAGNOSIS — J309 Allergic rhinitis, unspecified: Secondary | ICD-10-CM

## 2011-12-22 DIAGNOSIS — M1990 Unspecified osteoarthritis, unspecified site: Secondary | ICD-10-CM

## 2011-12-22 DIAGNOSIS — R635 Abnormal weight gain: Secondary | ICD-10-CM

## 2011-12-22 DIAGNOSIS — D72829 Elevated white blood cell count, unspecified: Secondary | ICD-10-CM

## 2011-12-22 DIAGNOSIS — Z79899 Other long term (current) drug therapy: Secondary | ICD-10-CM

## 2011-12-22 DIAGNOSIS — Z1211 Encounter for screening for malignant neoplasm of colon: Secondary | ICD-10-CM | POA: Insufficient documentation

## 2011-12-22 LAB — CBC WITH DIFFERENTIAL/PLATELET
Basophils Absolute: 0.1 10*3/uL (ref 0.0–0.1)
Basophils Relative: 1 % (ref 0–1)
HCT: 43.5 % (ref 36.0–46.0)
MCHC: 34 g/dL (ref 30.0–36.0)
Monocytes Absolute: 0.7 10*3/uL (ref 0.1–1.0)
Neutro Abs: 4.2 10*3/uL (ref 1.7–7.7)
Platelets: 191 10*3/uL (ref 150–400)
RDW: 12.9 % (ref 11.5–15.5)
WBC: 7.4 10*3/uL (ref 4.0–10.5)

## 2011-12-22 LAB — HEPATIC FUNCTION PANEL
AST: 20 U/L (ref 0–37)
Bilirubin, Direct: <0.1 mg/dL (ref 0.0–0.3)
Total Bilirubin: 0.4 mg/dL (ref 0.3–1.2)

## 2011-12-22 LAB — BASIC METABOLIC PANEL
BUN: 19 mg/dL (ref 6–23)
Creat: 0.93 mg/dL (ref 0.50–1.10)
Potassium: 4.6 mEq/L (ref 3.5–5.3)

## 2011-12-22 MED ORDER — FLUTICASONE PROPIONATE 50 MCG/ACT NA SUSP: 2.0000 | Freq: Every day | NASAL | Status: DC

## 2011-12-22 MED ORDER — DICLOFENAC-MISOPROSTOL 75-0.2 MG PO TABS: ORAL_TABLET | ORAL | Status: AC

## 2011-12-22 NOTE — Assessment & Plan Note (Signed)
Resume regular exercise

## 2011-12-22 NOTE — Assessment & Plan Note (Signed)
reattempt inhaled steroid spray. Notes sedation with AH. Consider singulair and/or allergy referral if sx's refractory

## 2011-12-22 NOTE — Assessment & Plan Note (Signed)
Repeat cbc with diff 

## 2011-12-22 NOTE — Progress Notes (Signed)
  Subjective:    Patient ID: Anna Cortez, female    DOB: 05-10-1946, 66 y.o.   MRN: [id number]  HPI Pt presents to clinic for followup of multiple medical problems. Former pt of Dr. Scotty Court. 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 Has nasal congestion and drainage. Feels this impairs her sleep. 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 Takes arthrotec prn without gi adverse effects. States has failed multiple other nsaids due to side effects. Recalls seeing rheumatology in the past with neg lab evaluation. Last labs reviewed with mild leukocytosis. Has never undergone colonoscopy. Declines mammogram recommendation. Pap utd 7/12.  Past Medical History  Diagnosis Date  . Osteoarthritis   . Degenerative disc disease   . Insomnia    Past Surgical History  Procedure Date  . Wisdom tooth extraction   . Knee surgery     Right Knee  . Tubal ligation     reports that she has never smoked. She has never used smokeless tobacco. She reports that she drinks alcohol. She reports that she does not use illicit drugs. family history includes Prostate cancer in her father.  There is no history of Heart disease, and Colon cancer, and Breast cancer, and Hypertension, and Diabetes, and Hyperlipidemia, . Allergies  Allergen Reactions  . Penicillins   . Prochlorperazine Edisylate   . Tolectin (Tolmetin Sodium)       Review of Systems see hpi     Objective:   Physical Exam  Physical Exam  Nursing note and vitals reviewed. Constitutional: Appears well-developed and well-nourished. No distress.  HENT: maxillary sinuses nt Head: Normocephalic and atraumatic.  Right Ear: External ear normal. Tm and canals nl Left Ear: External ear normal. tm and canals nl Eyes: Conjunctivae are normal. No scleral icterus.  Neck: Neck supple. Carotid bruit is not present.  Cardiovascular: Normal rate, regular rhythm and normal heart sounds.  Exam reveals no gallop and no friction  rub.   No murmur heard. Pulmonary/Chest: Effort normal and breath sounds normal. No respiratory distress. He has no wheezes. no rales.  Lymphadenopathy:    He has no cervical adenopathy.  Neurological:Alert.  Skin: Skin is warm and dry. Not diaphoretic.  Psychiatric: Has a normal mood and affect.        Assessment & Plan:

## 2011-12-22 NOTE — Assessment & Plan Note (Signed)
RF arthrotec. Obtain chem7/lft.

## 2011-12-22 NOTE — Assessment & Plan Note (Signed)
Refer for screening colonoscopy 

## 2012-01-12 ENCOUNTER — Telehealth: Payer: Self-pay | Admitting: Internal Medicine

## 2012-01-12 MED ORDER — MONTELUKAST SODIUM 10 MG PO TABS: 10.0000 mg | ORAL_TABLET | Freq: Every day | ORAL | Status: AC

## 2012-01-12 NOTE — Telephone Encounter (Signed)
Call placed to patient at [phone], she states she is still having sinus congestion,not relieved by nasal spray. S he has nasal drainage clear in color, headaches, and pain around eyes. She would like to know if there is something else that she could take.

## 2012-01-12 NOTE — Telephone Encounter (Signed)
singulair 10mg  po qd. #30 rf6

## 2012-01-12 NOTE — Telephone Encounter (Signed)
Call placed to patient at [phone], she was informed of medication per Dr Rodena Medin instructions. Rx sent to pharmacy

## 2012-01-12 NOTE — Telephone Encounter (Signed)
Patient seen last month for sinuses, given rx for flonase  Pt states she is not any better, please call

## 2012-02-20 NOTE — Progress Notes (Signed)
  Subjective:    Patient ID: Anna Cortez, female    DOB: 1946/03/18, 66 y.o.   MRN: [id number]  HPI    Review of Systems     Objective:   Physical Exam        Assessment & Plan:

## 2012-06-17 ENCOUNTER — Ambulatory Visit: Payer: BC Managed Care – PPO | Admitting: Internal Medicine

## 2012-12-04 DIAGNOSIS — E213 Hyperparathyroidism, unspecified: Secondary | ICD-10-CM | POA: Insufficient documentation

## 2016-08-14 DIAGNOSIS — N183 Chronic kidney disease, stage 3 unspecified: Secondary | ICD-10-CM | POA: Diagnosis present

## 2016-08-14 DIAGNOSIS — M519 Unspecified thoracic, thoracolumbar and lumbosacral intervertebral disc disorder: Secondary | ICD-10-CM | POA: Insufficient documentation

## 2016-11-27 DIAGNOSIS — I10 Essential (primary) hypertension: Secondary | ICD-10-CM | POA: Diagnosis present

## 2018-09-10 DIAGNOSIS — F329 Major depressive disorder, single episode, unspecified: Secondary | ICD-10-CM | POA: Diagnosis present

## 2018-09-10 DIAGNOSIS — F332 Major depressive disorder, recurrent severe without psychotic features: Secondary | ICD-10-CM | POA: Diagnosis present

## 2021-03-16 DIAGNOSIS — D3A020 Benign carcinoid tumor of the appendix: Secondary | ICD-10-CM | POA: Diagnosis present

## 2022-02-03 DIAGNOSIS — E785 Hyperlipidemia, unspecified: Secondary | ICD-10-CM | POA: Diagnosis present

## 2023-05-17 ENCOUNTER — Emergency Department (HOSPITAL_BASED_OUTPATIENT_CLINIC_OR_DEPARTMENT_OTHER)
Admission: EM | Admit: 2023-05-17 | Discharge: 2023-05-17 | Disposition: A | Discharge status: Home / Self Care | Payer: Medicare Other | Attending: Emergency Medicine | Admitting: Emergency Medicine

## 2023-05-17 ENCOUNTER — Other Ambulatory Visit: Payer: Self-pay

## 2023-05-17 ENCOUNTER — Encounter (HOSPITAL_BASED_OUTPATIENT_CLINIC_OR_DEPARTMENT_OTHER): Payer: Self-pay

## 2023-05-17 DIAGNOSIS — R519 Headache, unspecified: Secondary | ICD-10-CM | POA: Diagnosis not present

## 2023-05-17 DIAGNOSIS — R509 Fever, unspecified: Secondary | ICD-10-CM | POA: Insufficient documentation

## 2023-05-17 DIAGNOSIS — Z20822 Contact with and (suspected) exposure to covid-19: Secondary | ICD-10-CM | POA: Diagnosis not present

## 2023-05-17 DIAGNOSIS — R112 Nausea with vomiting, unspecified: Secondary | ICD-10-CM | POA: Diagnosis present

## 2023-05-17 LAB — COMPREHENSIVE METABOLIC PANEL
ALT: 16 U/L (ref 0–44)
AST: 19 U/L (ref 15–41)
Albumin: 4.4 g/dL (ref 3.5–5.0)
Alkaline Phosphatase: 76 U/L (ref 38–126)
Anion gap: 13 (ref 5–15)
BUN: 17 mg/dL (ref 8–23)
CO2: 20 mmol/L — ABNORMAL LOW (ref 22–32)
Calcium: 9.4 mg/dL (ref 8.9–10.3)
Chloride: 103 mmol/L (ref 98–111)
Creatinine, Ser: 0.88 mg/dL (ref 0.44–1.00)
GFR, Estimated: >60 mL/min (ref >60)
Glucose, Bld: 129 mg/dL — ABNORMAL HIGH (ref 70–99)
Potassium: 4.2 mmol/L (ref 3.5–5.1)
Sodium: 136 mmol/L (ref 135–145)
Total Bilirubin: 1 mg/dL (ref 0.3–1.2)
Total Protein: 7.5 g/dL (ref 6.5–8.1)

## 2023-05-17 LAB — CBC
HCT: 40.2 % (ref 36.0–46.0)
Hemoglobin: 14 g/dL (ref 12.0–15.0)
MCH: 33.6 pg (ref 26.0–34.0)
MCHC: 34.8 g/dL (ref 30.0–36.0)
MCV: 96.4 fL (ref 80.0–100.0)
Platelets: 164 10*3/uL (ref 150–400)
RBC: 4.17 MIL/uL (ref 3.87–5.11)
RDW: 12.4 % (ref 11.5–15.5)
WBC: 8.6 10*3/uL (ref 4.0–10.5)
nRBC: 0 % (ref 0.0–0.2)

## 2023-05-17 LAB — LIPASE, BLOOD: Lipase: 12 U/L (ref 11–51)

## 2023-05-17 LAB — RESP PANEL BY RT-PCR (RSV, FLU A&B, COVID)  RVPGX2
Influenza A by PCR: NEGATIVE
Influenza B by PCR: NEGATIVE
Resp Syncytial Virus by PCR: NEGATIVE
SARS Coronavirus 2 by RT PCR: NEGATIVE

## 2023-05-17 MED ORDER — DIAZEPAM 5 MG PO TABS: 5.0000 mg | ORAL_TABLET | Freq: Two times a day (BID) | ORAL | 0 refills | Status: DC

## 2023-05-17 MED ORDER — DIAZEPAM 5 MG/ML IJ SOLN
2.5000 mg | Freq: Once | INTRAMUSCULAR | Status: AC
  Administered 2023-05-17: 2.5 mg via INTRAVENOUS
  Filled 2023-05-17: qty 2

## 2023-05-17 MED ORDER — SODIUM CHLORIDE 0.9 % IV BOLUS
1000.0000 mL | Freq: Once | INTRAVENOUS | Status: AC
  Administered 2023-05-17: 1000 mL via INTRAVENOUS

## 2023-05-17 MED ORDER — ONDANSETRON 4 MG PO TBDP: 4.0000 mg | ORAL_TABLET | Freq: Three times a day (TID) | ORAL | 0 refills | Status: DC | PRN

## 2023-05-17 MED ORDER — ACETAMINOPHEN 325 MG PO TABS
650.0000 mg | ORAL_TABLET | Freq: Once | ORAL | Status: AC
  Administered 2023-05-17: 650 mg via ORAL
  Filled 2023-05-17: qty 2

## 2023-05-17 MED ORDER — METOCLOPRAMIDE HCL 5 MG/ML IJ SOLN
10.0000 mg | Freq: Once | INTRAMUSCULAR | Status: AC
  Administered 2023-05-17: 10 mg via INTRAVENOUS
  Filled 2023-05-17: qty 2

## 2023-05-17 MED ORDER — ONDANSETRON HCL 4 MG/2ML IJ SOLN
4.0000 mg | Freq: Once | INTRAMUSCULAR | Status: AC
  Administered 2023-05-17: 4 mg via INTRAVENOUS
  Filled 2023-05-17: qty 2

## 2023-05-17 NOTE — ED Notes (Signed)
Pt unable to give a urine sample, will ask the pt again at a later time.

## 2023-05-17 NOTE — Discharge Instructions (Addendum)
Please stick with a lighter diet including soup, soup broths, bananas, applesauce and drink plenty of fluids.  Zofran as needed for vomiting.  It will dissolve underneath your tongue.  Please take as prescribed.  Like for you to follow-up with your primary care doctor sometime next week to ensure we are making progress.  You may return to the emergency department for any worsening symptoms.

## 2023-05-17 NOTE — ED Provider Notes (Signed)
[place] EMERGENCY DEPARTMENT AT [facility] Provider Note   CSN: [id number] Arrival date & time: 05/17/23  1514     History Chief Complaint  Patient presents with   Nausea    Anna Cortez is a 77 y.o. female patient who presents to the emergency department today for further evaluation of nausea, vomiting, and fever.  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  She states that the symptoms started yesterday.  She also endorses associated headache.  She is unable to keep anything down.  HPI     Home Medications Prior to Admission medications   Medication Sig Start Date End Date Taking? Authorizing Provider  ondansetron (ZOFRAN-ODT) 4 MG disintegrating tablet Take 1 tablet (4 mg total) by mouth every 8 (eight) hours as needed for nausea or vomiting. 05/17/23  Yes Meredeth Ide, Linken Mcglothen M, PA-C  estradiol (ESTRACE) 0.5 MG tablet Take 1 tablet (0.5 mg total) by mouth daily. 05/16/11 05/15/12  Damian Leavell., MD  fluticasone [facility]) 50 MCG/ACT nasal spray Place 2 sprays into the nose daily. 12/22/11 12/21/12  Edwyna Perfect, MD      Allergies    Penicillins, Prochlorperazine edisylate, and Tolectin [tolmetin sodium]    Review of Systems   Review of Systems  All other systems reviewed and are negative.   Physical Exam Updated Vital Signs BP (!) 164/67   Pulse 75   Temp 98.7 F (37.1 C)   Resp 18   Ht 5\' 9"  (1.753 m)   Wt 81.6 kg   SpO2 94%   BMI 26.58 kg/m  Physical Exam Vitals and nursing note reviewed.  Constitutional:      General: She is not in acute distress.    Appearance: Normal appearance.  HENT:     Head: Normocephalic and atraumatic.  Eyes:     General:        Right eye: No discharge.        Left eye: No discharge.  Cardiovascular:     Comments: Regular rate and rhythm.  S1/S2 are distinct without any evidence of murmur, rubs, or gallops.  Radial pulses are 2+ bilaterally.  Dorsalis pedis  pulses are 2+ bilaterally.  No evidence of pedal edema. Pulmonary:     Comments: Clear to auscultation bilaterally.  Normal effort.  No respiratory distress.  No evidence of wheezes, rales, or rhonchi heard throughout. Abdominal:     General: Abdomen is flat. Bowel sounds are normal. There is no distension.     Tenderness: There is no abdominal tenderness. There is no guarding or rebound.  Musculoskeletal:        General: Normal range of motion.     Cervical back: Neck supple.  Skin:    General: Skin is warm and dry.     Findings: No rash.  Neurological:     General: No focal deficit present.     Mental Status: She is alert.  Psychiatric:        Mood and Affect: Mood normal.        Behavior: Behavior normal.     ED Results / Procedures / Treatments   Labs (all labs ordered are listed, but only abnormal results are displayed) Labs Reviewed  COMPREHENSIVE METABOLIC PANEL - Abnormal; Notable for the following components:      Result Value   CO2 20 (*)    Glucose, Bld 129 (*)    All other components within normal limits  RESP PANEL BY  RT-PCR (RSV, FLU A&B, COVID)  RVPGX2  LIPASE, BLOOD  CBC  URINALYSIS, ROUTINE W REFLEX MICROSCOPIC    EKG None  Radiology No results found.  Procedures Procedures    Medications Ordered in ED Medications  sodium chloride 0.9 % bolus 1,000 mL (0 mLs Intravenous Stopped 05/17/23 1828)  ondansetron (ZOFRAN) injection 4 mg (4 mg Intravenous Given 05/17/23 1720)  acetaminophen (TYLENOL) tablet 650 mg (650 mg Oral Given 05/17/23 1824)    ED Course/ Medical Decision Making/ A&P Clinical Course as of 05/17/23 1927  Thu May 17, 2023  1739 Stable N/V  Labs reassuring/ Treating and PO challenge. [CC]  1841 CBC Normal. [CF]  1841 Resp panel by RT-PCR (RSV, Flu A&B, Covid) Anterior Nasal Swab Normal.  [CF]  1841 Lipase, blood Normal.  [CF]  1841 Comprehensive metabolic panel(!) Normal.  [CF]  1859 On reevaluation, patient is feeling better  from a nausea and vomiting perspective.  Her headache still persists.  Went over all labs with her at the bedside.  Will plan to fluid challenge her and discharge her home with Zofran.  Patient and family amenable to this plan. [CF]  1924 Patient tolerated p.o. challenge without difficulty. [CF]    Clinical Course User Index [CC] Glyn Ade, MD [CF] Teressa Lower, PA-C   {   Click here for ABCD2, HEART and other calculators  Medical Decision Making Anna Cortez is a 77 y.o. female patient who presents to the emergency department today for further evaluation of nausea, vomiting, and fever.  Patient did have COVID and flu testing at urgent care they are both negative. Will likely repeat. I will plan to give her some Zofran, fluids, and Tylenol and plan to reassess.  Will also get some abdominal pain labs given the patient's age.  She has no abdominal tenderness to indicate any imaging at this time.  Patient feeling somewhat better.  This is likely viral gastroenteritis.  I will prescribe her Zofran to go home with.  Strict return precautions were discussed.  She is safer discharge.  I will follow-up with her primary care doctor for further evaluation.  Amount and/or Complexity of Data Reviewed Labs: ordered. Decision-making details documented in ED Course.  Risk OTC drugs. Prescription drug management.    Final Clinical Impression(s) / ED Diagnoses Final diagnoses:  Nausea and vomiting, unspecified vomiting type    Rx / DC Orders ED Discharge Orders          Ordered    ondansetron (ZOFRAN-ODT) 4 MG disintegrating tablet  Every 8 hours PRN        05/17/23 1924              Teressa Lower, Cordelia Poche 05/17/23 1927    Glyn Ade, MD 05/18/23 1459

## 2023-05-17 NOTE — ED Notes (Signed)
Patient voiced improvement

## 2023-05-17 NOTE — ED Notes (Signed)
PO Gatorade given

## 2023-05-17 NOTE — ED Notes (Signed)
Patient having active nausea and vomiting with c/o headache 6/10  MD made aware

## 2023-05-17 NOTE — ED Notes (Signed)
Trial of ambulation and po successful. Patient states feels confident can go home with improvement

## 2023-05-17 NOTE — ED Triage Notes (Signed)
Patient here POV from Home.  Endorses Body Aches that began yesterday. 100 Temp 20 Minutes ago. Some N/V.  NAD Noted during triage. A&Ox4. GCS 15. Ambulatory.

## 2023-05-17 NOTE — ED Notes (Addendum)
Attempted to collect urine. Pt unable to void at this time. Will check back with patient.

## 2023-05-18 ENCOUNTER — Emergency Department (HOSPITAL_BASED_OUTPATIENT_CLINIC_OR_DEPARTMENT_OTHER): Payer: Medicare Other

## 2023-05-18 ENCOUNTER — Encounter (HOSPITAL_BASED_OUTPATIENT_CLINIC_OR_DEPARTMENT_OTHER): Payer: Self-pay | Admitting: Emergency Medicine

## 2023-05-18 ENCOUNTER — Other Ambulatory Visit: Payer: Self-pay

## 2023-05-18 ENCOUNTER — Inpatient Hospital Stay (HOSPITAL_BASED_OUTPATIENT_CLINIC_OR_DEPARTMENT_OTHER)
Admission: EM | Admit: 2023-05-18 | Discharge: 2023-05-24 | DRG: 075 | Disposition: A | Discharge status: Home Health | Payer: Medicare Other | Attending: Internal Medicine | Admitting: Internal Medicine

## 2023-05-18 DIAGNOSIS — E3400 Carcinoid syndrome, unspecified: Secondary | ICD-10-CM

## 2023-05-18 DIAGNOSIS — I10 Essential (primary) hypertension: Secondary | ICD-10-CM | POA: Diagnosis present

## 2023-05-18 DIAGNOSIS — Z880 Allergy status to penicillin: Secondary | ICD-10-CM

## 2023-05-18 DIAGNOSIS — F329 Major depressive disorder, single episode, unspecified: Secondary | ICD-10-CM | POA: Diagnosis present

## 2023-05-18 DIAGNOSIS — R4182 Altered mental status, unspecified: Secondary | ICD-10-CM | POA: Diagnosis present

## 2023-05-18 DIAGNOSIS — Z1152 Encounter for screening for COVID-19: Secondary | ICD-10-CM

## 2023-05-18 DIAGNOSIS — N183 Chronic kidney disease, stage 3 unspecified: Secondary | ICD-10-CM | POA: Diagnosis present

## 2023-05-18 DIAGNOSIS — R5081 Fever presenting with conditions classified elsewhere: Secondary | ICD-10-CM

## 2023-05-18 DIAGNOSIS — R4781 Slurred speech: Secondary | ICD-10-CM | POA: Diagnosis present

## 2023-05-18 DIAGNOSIS — F411 Generalized anxiety disorder: Secondary | ICD-10-CM | POA: Diagnosis present

## 2023-05-18 DIAGNOSIS — Z923 Personal history of irradiation: Secondary | ICD-10-CM

## 2023-05-18 DIAGNOSIS — F332 Major depressive disorder, recurrent severe without psychotic features: Secondary | ICD-10-CM | POA: Diagnosis present

## 2023-05-18 DIAGNOSIS — G9341 Metabolic encephalopathy: Secondary | ICD-10-CM | POA: Diagnosis present

## 2023-05-18 DIAGNOSIS — E860 Dehydration: Secondary | ICD-10-CM | POA: Diagnosis present

## 2023-05-18 DIAGNOSIS — G039 Meningitis, unspecified: Principal | ICD-10-CM

## 2023-05-18 DIAGNOSIS — Z79899 Other long term (current) drug therapy: Secondary | ICD-10-CM

## 2023-05-18 DIAGNOSIS — N1832 Chronic kidney disease, stage 3b: Secondary | ICD-10-CM | POA: Diagnosis present

## 2023-05-18 DIAGNOSIS — N179 Acute kidney failure, unspecified: Secondary | ICD-10-CM | POA: Diagnosis present

## 2023-05-18 DIAGNOSIS — C7B00 Secondary carcinoid tumors, unspecified site: Secondary | ICD-10-CM

## 2023-05-18 DIAGNOSIS — A879 Viral meningitis, unspecified: Principal | ICD-10-CM | POA: Diagnosis present

## 2023-05-18 DIAGNOSIS — R291 Meningismus: Secondary | ICD-10-CM

## 2023-05-18 DIAGNOSIS — E785 Hyperlipidemia, unspecified: Secondary | ICD-10-CM | POA: Diagnosis present

## 2023-05-18 DIAGNOSIS — K219 Gastro-esophageal reflux disease without esophagitis: Secondary | ICD-10-CM | POA: Diagnosis present

## 2023-05-18 DIAGNOSIS — C7B8 Other secondary neuroendocrine tumors: Secondary | ICD-10-CM

## 2023-05-18 DIAGNOSIS — D3A020 Benign carcinoid tumor of the appendix: Secondary | ICD-10-CM | POA: Diagnosis present

## 2023-05-18 DIAGNOSIS — R112 Nausea with vomiting, unspecified: Secondary | ICD-10-CM

## 2023-05-18 DIAGNOSIS — I129 Hypertensive chronic kidney disease with stage 1 through stage 4 chronic kidney disease, or unspecified chronic kidney disease: Secondary | ICD-10-CM | POA: Diagnosis present

## 2023-05-18 DIAGNOSIS — Z888 Allergy status to other drugs, medicaments and biological substances status: Secondary | ICD-10-CM

## 2023-05-18 LAB — CBC WITH DIFFERENTIAL/PLATELET
Abs Immature Granulocytes: 0.03 10*3/uL (ref 0.00–0.07)
Basophils Absolute: 0 10*3/uL (ref 0.0–0.1)
Basophils Relative: 0 %
Eosinophils Absolute: 0 10*3/uL (ref 0.0–0.5)
Eosinophils Relative: 0 %
HCT: 37.7 % (ref 36.0–46.0)
Hemoglobin: 13 g/dL (ref 12.0–15.0)
Immature Granulocytes: 0 %
Lymphocytes Relative: 15 %
Lymphs Abs: 1.4 10*3/uL (ref 0.7–4.0)
MCH: 33.4 pg (ref 26.0–34.0)
MCHC: 34.5 g/dL (ref 30.0–36.0)
MCV: 96.9 fL (ref 80.0–100.0)
Monocytes Absolute: 0.7 10*3/uL (ref 0.1–1.0)
Monocytes Relative: 7 %
Neutro Abs: 7.1 10*3/uL (ref 1.7–7.7)
Neutrophils Relative %: 78 %
Platelets: 135 10*3/uL — ABNORMAL LOW (ref 150–400)
RBC: 3.89 MIL/uL (ref 3.87–5.11)
RDW: 12.4 % (ref 11.5–15.5)
WBC: 9.2 10*3/uL (ref 4.0–10.5)
nRBC: 0 % (ref 0.0–0.2)

## 2023-05-18 LAB — URINALYSIS, W/ REFLEX TO CULTURE (INFECTION SUSPECTED)
Bacteria, UA: NONE SEEN
Bilirubin Urine: NEGATIVE
Glucose, UA: NEGATIVE mg/dL
Hgb urine dipstick: NEGATIVE
Ketones, ur: NEGATIVE mg/dL
Nitrite: NEGATIVE
Specific Gravity, Urine: >1.046 — ABNORMAL HIGH (ref 1.005–1.030)
pH: 6 (ref 5.0–8.0)

## 2023-05-18 LAB — PROTIME-INR
INR: 1.1 (ref 0.8–1.2)
Prothrombin Time: 14.6 seconds (ref 11.4–15.2)

## 2023-05-18 LAB — COMPREHENSIVE METABOLIC PANEL
ALT: 16 U/L (ref 0–44)
AST: 20 U/L (ref 15–41)
Albumin: 4.5 g/dL (ref 3.5–5.0)
Alkaline Phosphatase: 53 U/L (ref 38–126)
Anion gap: 10 (ref 5–15)
BUN: 21 mg/dL (ref 8–23)
CO2: 25 mmol/L (ref 22–32)
Calcium: 9 mg/dL (ref 8.9–10.3)
Chloride: 103 mmol/L (ref 98–111)
Creatinine, Ser: 1.16 mg/dL — ABNORMAL HIGH (ref 0.44–1.00)
GFR, Estimated: 49 mL/min — ABNORMAL LOW (ref >60)
Glucose, Bld: 158 mg/dL — ABNORMAL HIGH (ref 70–99)
Potassium: 3.7 mmol/L (ref 3.5–5.1)
Sodium: 138 mmol/L (ref 135–145)
Total Bilirubin: 0.9 mg/dL (ref 0.3–1.2)
Total Protein: 7 g/dL (ref 6.5–8.1)

## 2023-05-18 LAB — CK: Total CK: 220 U/L (ref 38–234)

## 2023-05-18 LAB — RESP PANEL BY RT-PCR (RSV, FLU A&B, COVID)  RVPGX2
Influenza A by PCR: NEGATIVE
Influenza B by PCR: NEGATIVE
Resp Syncytial Virus by PCR: NEGATIVE
SARS Coronavirus 2 by RT PCR: NEGATIVE

## 2023-05-18 LAB — APTT: aPTT: 29 seconds (ref 24–36)

## 2023-05-18 LAB — LACTIC ACID, PLASMA: Lactic Acid, Venous: 1.5 mmol/L (ref 0.5–1.9)

## 2023-05-18 MED ORDER — VANCOMYCIN HCL IN DEXTROSE 1-5 GM/200ML-% IV SOLN: 1000.0000 mg | Freq: Once | INTRAVENOUS | Status: DC

## 2023-05-18 MED ORDER — SODIUM CHLORIDE 0.9 % IV SOLN
2.0000 g | Freq: Once | INTRAVENOUS | Status: DC
  Filled 2023-05-18: qty 12.5

## 2023-05-18 MED ORDER — LACTATED RINGERS IV BOLUS
1000.0000 mL | Freq: Once | INTRAVENOUS | Status: AC
  Administered 2023-05-18: 1000 mL via INTRAVENOUS

## 2023-05-18 MED ORDER — VANCOMYCIN HCL IN DEXTROSE 1-5 GM/200ML-% IV SOLN
1000.0000 mg | Freq: Once | INTRAVENOUS | Status: AC
  Administered 2023-05-18: 1000 mg via INTRAVENOUS
  Filled 2023-05-18: qty 200

## 2023-05-18 MED ORDER — SODIUM CHLORIDE 0.9 % IV SOLN: 2.0000 g | Freq: Once | INTRAVENOUS | Status: DC

## 2023-05-18 MED ORDER — LACTATED RINGERS IV SOLN: INTRAVENOUS | Status: AC

## 2023-05-18 MED ORDER — LACTATED RINGERS IV BOLUS (SEPSIS)
1000.0000 mL | Freq: Once | INTRAVENOUS | Status: AC
  Administered 2023-05-18: 1000 mL via INTRAVENOUS

## 2023-05-18 MED ORDER — IOHEXOL 300 MG/ML  SOLN
100.0000 mL | Freq: Once | INTRAMUSCULAR | Status: AC | PRN
  Administered 2023-05-18: 100 mL via INTRAVENOUS

## 2023-05-18 MED ORDER — ONDANSETRON HCL 4 MG/2ML IJ SOLN
4.0000 mg | Freq: Once | INTRAMUSCULAR | Status: AC | PRN
  Administered 2023-05-18: 4 mg via INTRAVENOUS
  Filled 2023-05-18: qty 2

## 2023-05-18 MED ORDER — LACTATED RINGERS IV SOLN: INTRAVENOUS | Status: DC

## 2023-05-18 MED ORDER — SODIUM CHLORIDE 0.9 % IV SOLN
2.0000 g | Freq: Two times a day (BID) | INTRAVENOUS | Status: DC
  Administered 2023-05-18: 2 g via INTRAVENOUS

## 2023-05-18 MED ORDER — LACTATED RINGERS IV BOLUS (SEPSIS)
500.0000 mL | Freq: Once | INTRAVENOUS | Status: AC
  Administered 2023-05-18: 500 mL via INTRAVENOUS

## 2023-05-18 MED ORDER — VANCOMYCIN HCL 1500 MG/300ML IV SOLN
1500.0000 mg | INTRAVENOUS | Status: DC
  Filled 2023-05-18: qty 300

## 2023-05-18 MED ORDER — METRONIDAZOLE 500 MG/100ML IV SOLN: 500.0000 mg | Freq: Once | INTRAVENOUS | Status: DC

## 2023-05-18 MED ORDER — METRONIDAZOLE 500 MG/100ML IV SOLN
500.0000 mg | Freq: Once | INTRAVENOUS | Status: AC
  Administered 2023-05-18: 500 mg via INTRAVENOUS
  Filled 2023-05-18: qty 100

## 2023-05-18 MED ORDER — VANCOMYCIN HCL 750 MG IV SOLR
750.0000 mg | Freq: Once | INTRAVENOUS | Status: AC
  Administered 2023-05-19: 750 mg via INTRAVENOUS
  Filled 2023-05-18: qty 15

## 2023-05-18 MED ORDER — ACETAMINOPHEN 500 MG PO TABS
1000.0000 mg | ORAL_TABLET | Freq: Once | ORAL | Status: AC
  Administered 2023-05-18: 1000 mg via ORAL
  Filled 2023-05-18 (×2): qty 2

## 2023-05-18 NOTE — ED Notes (Signed)
Report given to the next RN... 

## 2023-05-18 NOTE — ED Triage Notes (Addendum)
Pt presents to ED Pov. Pt c/o n/v, fever. Reports that she was here yesterday and not feeling any better. Taking zofran and valium w/o relief.

## 2023-05-18 NOTE — ED Notes (Signed)
Pt to CT at this time.

## 2023-05-18 NOTE — Progress Notes (Signed)
Pt being followed by [facility] for Sepsis protocol. 

## 2023-05-18 NOTE — Progress Notes (Signed)
Pharmacy Antibiotic Note  Anna Cortez is a 77 y.o. female admitted on 05/18/2023 with sepsis.  Pharmacy has been consulted for Cefepime and Vancomycin dosing.  WBC 9.2, Tmax 104.1 LA 1.5, HR 58, RR 21 SCr 1.16  Plan: Initiate Cefepime 2g IV q12h Initiate loading dose of Vancomycin 1750mg  IV (1000mg  + 750mg ), followed by  Vancomycin 1500mg  IV q24h (eAUC ~510)    > Goal AUC 400-550    > Check vancomycin levels at steady state  Continue Metronidazole 500mg  IV x1 per MD Monitor daily CBC, temp, SCr, and for clinical signs of improvement  F/u cultures and de-escalate antibiotics as able     Temp (24hrs), Avg:103.3 F (39.6 C), Min:102.4 F (39.1 C), Max:104.1 F (40.1 C)  Recent Labs  Lab 05/17/23 1522 05/18/23 2033  WBC 8.6 9.2  CREATININE 0.88 1.16*  LATICACIDVEN  --  1.5    Estimated Creatinine Clearance: 47.9 mL/min (A) (by C-G formula based on SCr of 1.16 mg/dL (H)).    Allergies  Allergen Reactions   Penicillins    Prochlorperazine Edisylate    Tolectin [Tolmetin Sodium]     Antimicrobials this admission: Cefepime 7/19 >>  Vancomycin 7/19 >>  Metronidazole 7/19 x1  Dose adjustments this admission: N/A  Microbiology results: 7/19 BCx: sent   Thank you for allowing pharmacy to be a part of this patient's care.  Wilburn Cornelia, PharmD, BCPS Clinical Pharmacist 05/18/2023 10:12 PM   Please refer to [facility] for pharmacy phone number

## 2023-05-19 ENCOUNTER — Observation Stay (HOSPITAL_COMMUNITY): Payer: Medicare Other

## 2023-05-19 DIAGNOSIS — F411 Generalized anxiety disorder: Secondary | ICD-10-CM | POA: Diagnosis not present

## 2023-05-19 DIAGNOSIS — E785 Hyperlipidemia, unspecified: Secondary | ICD-10-CM

## 2023-05-19 DIAGNOSIS — R4182 Altered mental status, unspecified: Secondary | ICD-10-CM

## 2023-05-19 DIAGNOSIS — N183 Chronic kidney disease, stage 3 unspecified: Secondary | ICD-10-CM

## 2023-05-19 DIAGNOSIS — D3A020 Benign carcinoid tumor of the appendix: Secondary | ICD-10-CM | POA: Diagnosis not present

## 2023-05-19 DIAGNOSIS — R509 Fever, unspecified: Secondary | ICD-10-CM

## 2023-05-19 DIAGNOSIS — F332 Major depressive disorder, recurrent severe without psychotic features: Secondary | ICD-10-CM

## 2023-05-19 DIAGNOSIS — R519 Headache, unspecified: Secondary | ICD-10-CM

## 2023-05-19 DIAGNOSIS — I10 Essential (primary) hypertension: Secondary | ICD-10-CM | POA: Diagnosis not present

## 2023-05-19 DIAGNOSIS — K219 Gastro-esophageal reflux disease without esophagitis: Secondary | ICD-10-CM

## 2023-05-19 LAB — CREATININE, SERUM
Creatinine, Ser: 0.69 mg/dL (ref 0.44–1.00)
GFR, Estimated: >60 mL/min (ref >60)

## 2023-05-19 LAB — COMPREHENSIVE METABOLIC PANEL
ALT: 26 U/L (ref 0–44)
AST: 33 U/L (ref 15–41)
Albumin: 2.8 g/dL — ABNORMAL LOW (ref 3.5–5.0)
Alkaline Phosphatase: 46 U/L (ref 38–126)
Anion gap: 10 (ref 5–15)
BUN: 18 mg/dL (ref 8–23)
CO2: 25 mmol/L (ref 22–32)
Calcium: 8.1 mg/dL — ABNORMAL LOW (ref 8.9–10.3)
Chloride: 102 mmol/L (ref 98–111)
Creatinine, Ser: 1.04 mg/dL — ABNORMAL HIGH (ref 0.44–1.00)
GFR, Estimated: 56 mL/min — ABNORMAL LOW (ref >60)
Glucose, Bld: 108 mg/dL — ABNORMAL HIGH (ref 70–99)
Potassium: 3.8 mmol/L (ref 3.5–5.1)
Sodium: 137 mmol/L (ref 135–145)
Total Bilirubin: 0.6 mg/dL (ref 0.3–1.2)
Total Protein: 5.4 g/dL — ABNORMAL LOW (ref 6.5–8.1)

## 2023-05-19 LAB — RESPIRATORY PANEL BY PCR

## 2023-05-19 LAB — CBC
HCT: 35 % — ABNORMAL LOW (ref 36.0–46.0)
Hemoglobin: 12 g/dL (ref 12.0–15.0)
MCH: 34.2 pg — ABNORMAL HIGH (ref 26.0–34.0)
MCHC: 34.3 g/dL (ref 30.0–36.0)
MCV: 99.7 fL (ref 80.0–100.0)
Platelets: 90 10*3/uL — ABNORMAL LOW (ref 150–400)
RBC: 3.51 MIL/uL — ABNORMAL LOW (ref 3.87–5.11)
RDW: 12.3 % (ref 11.5–15.5)
WBC: 8.5 10*3/uL (ref 4.0–10.5)
nRBC: 0 % (ref 0.0–0.2)

## 2023-05-19 LAB — AMMONIA: Ammonia: 15 umol/L (ref 9–35)

## 2023-05-19 MED ORDER — VANCOMYCIN HCL IN DEXTROSE 1-5 GM/200ML-% IV SOLN
1000.0000 mg | Freq: Two times a day (BID) | INTRAVENOUS | Status: DC
  Administered 2023-05-19 – 2023-05-22 (×6): 1000 mg via INTRAVENOUS
  Filled 2023-05-19 (×7): qty 200

## 2023-05-19 MED ORDER — DEXTROSE 5 % IV SOLN
10.0000 mg/kg | Freq: Three times a day (TID) | INTRAVENOUS | Status: DC
  Administered 2023-05-19 – 2023-05-22 (×8): 875 mg via INTRAVENOUS
  Filled 2023-05-19 (×13): qty 17.5

## 2023-05-19 MED ORDER — IBUPROFEN 400 MG PO TABS
600.0000 mg | ORAL_TABLET | Freq: Once | ORAL | Status: AC
  Administered 2023-05-19: 600 mg via ORAL

## 2023-05-19 MED ORDER — ACETAMINOPHEN 325 MG PO TABS
ORAL_TABLET | ORAL | Status: AC
  Filled 2023-05-19: qty 2

## 2023-05-19 MED ORDER — IBUPROFEN 200 MG PO TABS
ORAL_TABLET | ORAL | Status: AC
  Filled 2023-05-19: qty 1

## 2023-05-19 MED ORDER — HYDROMORPHONE HCL 1 MG/ML IJ SOLN
1.0000 mg | Freq: Once | INTRAMUSCULAR | Status: AC
  Administered 2023-05-19: 1 mg via INTRAVENOUS
  Filled 2023-05-19: qty 1

## 2023-05-19 MED ORDER — ACETAMINOPHEN 325 MG PO TABS
650.0000 mg | ORAL_TABLET | Freq: Once | ORAL | Status: AC
  Administered 2023-05-19: 650 mg via ORAL

## 2023-05-19 MED ORDER — POLYETHYLENE GLYCOL 3350 17 G PO PACK
17.0000 g | PACK | Freq: Every day | ORAL | Status: DC | PRN
  Administered 2023-05-21: 17 g via ORAL
  Filled 2023-05-19: qty 1

## 2023-05-19 MED ORDER — HYDROMORPHONE HCL 1 MG/ML IJ SOLN
0.5000 mg | INTRAMUSCULAR | Status: AC | PRN
  Administered 2023-05-19 – 2023-05-20 (×2): 0.5 mg via INTRAVENOUS
  Filled 2023-05-19 (×2): qty 0.5

## 2023-05-19 MED ORDER — ACETAMINOPHEN 325 MG PO TABS
650.0000 mg | ORAL_TABLET | Freq: Four times a day (QID) | ORAL | Status: DC | PRN
  Administered 2023-05-19 – 2023-05-24 (×8): 650 mg via ORAL
  Filled 2023-05-19 (×10): qty 2

## 2023-05-19 MED ORDER — ONDANSETRON HCL 4 MG/2ML IJ SOLN
4.0000 mg | Freq: Once | INTRAMUSCULAR | Status: AC | PRN
  Administered 2023-05-19: 4 mg via INTRAVENOUS
  Filled 2023-05-19: qty 2

## 2023-05-19 MED ORDER — IBUPROFEN 400 MG PO TABS
ORAL_TABLET | ORAL | Status: AC
  Filled 2023-05-19: qty 1

## 2023-05-19 MED ORDER — ASPIRIN-ACETAMINOPHEN-CAFFEINE 250-250-65 MG PO TABS
1.0000 | ORAL_TABLET | Freq: Four times a day (QID) | ORAL | Status: DC | PRN
  Administered 2023-05-20 – 2023-05-23 (×9): 1 via ORAL
  Filled 2023-05-19 (×11): qty 1

## 2023-05-19 MED ORDER — LACTATED RINGERS IV SOLN: INTRAVENOUS | Status: DC

## 2023-05-19 MED ORDER — SODIUM CHLORIDE 0.9% FLUSH
3.0000 mL | Freq: Two times a day (BID) | INTRAVENOUS | Status: DC
  Administered 2023-05-19 – 2023-05-24 (×10): 3 mL via INTRAVENOUS

## 2023-05-19 MED ORDER — ACETAMINOPHEN 650 MG RE SUPP: 650.0000 mg | Freq: Four times a day (QID) | RECTAL | Status: DC | PRN

## 2023-05-19 MED ORDER — SODIUM CHLORIDE 0.9 % IV SOLN
2.0000 g | Freq: Two times a day (BID) | INTRAVENOUS | Status: DC
  Administered 2023-05-19 – 2023-05-22 (×6): 2 g via INTRAVENOUS
  Filled 2023-05-19 (×6): qty 20

## 2023-05-19 MED ORDER — SODIUM CHLORIDE 0.9 % IV SOLN
2.0000 g | Freq: Once | INTRAVENOUS | Status: AC
  Administered 2023-05-19: 2 g via INTRAVENOUS
  Filled 2023-05-19: qty 20

## 2023-05-19 MED ORDER — VANCOMYCIN HCL 1250 MG/250ML IV SOLN
1250.0000 mg | INTRAVENOUS | Status: DC
  Filled 2023-05-19: qty 250

## 2023-05-19 MED ORDER — VANCOMYCIN HCL 1500 MG/300ML IV SOLN
1500.0000 mg | INTRAVENOUS | Status: DC
  Filled 2023-05-19: qty 300

## 2023-05-19 MED ORDER — ENOXAPARIN SODIUM 40 MG/0.4ML IJ SOSY
40.0000 mg | PREFILLED_SYRINGE | INTRAMUSCULAR | Status: DC
  Administered 2023-05-19 – 2023-05-23 (×5): 40 mg via SUBCUTANEOUS
  Filled 2023-05-19 (×5): qty 0.4

## 2023-05-19 NOTE — ED Provider Notes (Signed)
I provided a substantive portion of the care of this patient.  I personally made/approved the management plan for this patient and take responsibility for the patient management.  EKG Interpretation Date/Time:  Friday May 18 2023 23:25:00 EDT Ventricular Rate:  68 PR Interval:  170 QRS Duration:  84 QT Interval:  389 QTC Calculation: 414 R Axis:   63  Text Interpretation: Sinus rhythm Confirmed by Vanetta Mulders [phone]) on 05/18/2023 11:39:47 PM   Patient seen by me along with physician assistant.  Patient seen here yesterday with a history of fevers at home headache.  Patient was given migraine cocktail patient also had nausea and vomiting.  Patient apparently improved.  COVID testing and labs were normal.  Patient returns today appearing very dehydrated.  Does show evidence of acute kidney injury here.  Concern now is for meningitis.  Patient does have a fever here.  Patient started with broad-spectrum antibiotics we were not sure the etiology.  Did attempt a lumbar puncture without success.  Patient will require admission.  CRITICAL CARE Performed by: Vanetta Mulders Total critical care time: 45 minutes Critical care time was exclusive of separately billable procedures and treating other patients. Critical care was necessary to treat or prevent imminent or life-threatening deterioration. Critical care was time spent personally by me on the following activities: development of treatment plan with patient and/or surrogate as well as nursing, discussions with consultants, evaluation of patient's response to treatment, examination of patient, obtaining history from patient or surrogate, ordering and performing treatments and interventions, ordering and review of laboratory studies, ordering and review of radiographic studies, pulse oximetry and re-evaluation of patient's condition.  Patient improved here with IV fluids and improving with pain medicine.  .Lumbar Puncture  Date/Time:  05/19/2023 12:53 AM  Performed by: Vanetta Mulders, MD Authorized by: Vanetta Mulders, MD   Consent:    Consent obtained:  Written   Consent given by:  Guardian   Risks, benefits, and alternatives were discussed: yes     Risks discussed:  Bleeding, infection, pain, repeat procedure, headache and nerve damage   Alternatives discussed:  Delayed treatment Universal protocol:    Procedure explained and questions answered to patient or proxy's satisfaction: yes     Relevant documents present and verified: yes     Test results available: yes     Imaging studies available: yes     Required blood products, implants, devices, and special equipment available: yes     Immediately prior to procedure a time out was called: yes     Site/side marked: yes     Patient identity confirmed:  Arm band Pre-procedure details:    Procedure purpose:  Diagnostic   Preparation: Patient was prepped and draped in usual sterile fashion   Anesthesia:    Anesthesia method:  Local infiltration   Local anesthetic:  Lidocaine 1% w/o epi Procedure details:    Lumbar space:  L3-L4 interspace   Patient position:  Sitting   Needle gauge:  20   Ultrasound guidance: no     Number of attempts:  2 Post-procedure details:    Puncture site:  Adhesive bandage applied   Procedure completion:  Tolerated well, no immediate complications Comments:     Lumbar puncture attempt was unsuccessful.  Could not get into the space kept hitting bone.   His labs here CT head CT abdomen and pelvis without any acute findings.  Patient's urinalysis questionable urinary tract infection.   Vanetta Mulders, MD 05/19/23 [phone]

## 2023-05-19 NOTE — ED Provider Notes (Signed)
[place] EMERGENCY DEPARTMENT AT [facility] Provider Note   CSN: [id number] Arrival date & time: 05/18/23  1950     History Chief Complaint  Patient presents with   Fever   Nausea    Anna Cortez is a 77 y.o. female with history of neuroendocrine tumors presents the emergency room today for evaluation of nausea, vomiting, fever, headache.  Patient is confused, so history is limited.  She presents with sister at bedside.  Sister reports that they were here yesterday with labs and were ultimately discharged home.  She has been trying the Valium and Zofran but is still having symptoms.  Unable to tolerate any food or fluids.  Sister reports that she did have a fever yesterday however they got to the emergency department she did not not have a fever any longer.  Reports that she thinks she had a fever today for at least the past 8 hours.  Around 1400 today, the sister noticed that the patient was having trouble with her speech and having some slurring or word confusion.  This is atypical for her.  She had been complaining about headaches for the past 2 days, with today being the third day.  She was given a migraine cocktail yesterday, per sister and reports that she was feeling better afterwards.  The patient recently moved back to the [place] area over the past 6 months.  She was previously from [place] and lived there for around 4 years.  The sister is not aware of her medical problems other than the fact that she has neuroendocrine tumors.  She does not know the patient is on any medications or any other medical problems.  Her sister reports that she has chronic diarrhea however has not complained to her that is been worsening.  Does not complain about any abdominal pain as well.  Patient denies any abdominal pain or worsening diarrhea.  Denies any color changes or blood.  History is limited due to patient's confusion.   Fever Associated symptoms: diarrhea (At baseline),  headaches, nausea and vomiting        Home Medications Prior to Admission medications   Medication Sig Start Date End Date Taking? Authorizing Provider  diazepam (VALIUM) 5 MG tablet Take 1 tablet (5 mg total) by mouth 2 (two) times daily. 05/17/23   Glyn Ade, MD  estradiol (ESTRACE) 0.5 MG tablet Take 1 tablet (0.5 mg total) by mouth daily. 05/16/11 05/15/12  Damian Leavell., MD  fluticasone [facility]) 50 MCG/ACT nasal spray Place 2 sprays into the nose daily. 12/22/11 12/21/12  Edwyna Perfect, MD  ondansetron (ZOFRAN-ODT) 4 MG disintegrating tablet Take 1 tablet (4 mg total) by mouth every 8 (eight) hours as needed for nausea or vomiting. 05/17/23   Teressa Lower, PA-C      Allergies    Penicillins, Prochlorperazine edisylate, and Tolectin [tolmetin sodium]    Review of Systems   Review of Systems  Constitutional:  Positive for fever.  Gastrointestinal:  Positive for diarrhea (At baseline), nausea and vomiting. Negative for abdominal pain.  Neurological:  Positive for headaches.    Physical Exam Updated Vital Signs BP (!) 114/49   Pulse (!) 53   Temp (!) 101.2 F (38.4 C) (Rectal)   Resp 16   SpO2 97%  Physical Exam Vitals and nursing note reviewed.  Constitutional:      Appearance: She is ill-appearing.     Comments: Somnolent, but awakens to voice  HENT:  Mouth/Throat:     Mouth: Mucous membranes are dry.     Comments: Dry mucous membranes with cracked lips.  No pharyngeal erythema or edema or exudate noted.  Uvula midline. Eyes:     General: No scleral icterus.    Extraocular Movements: Extraocular movements intact.     Pupils: Pupils are equal, round, and reactive to light.  Cardiovascular:     Rate and Rhythm: Normal rate.  Pulmonary:     Effort: Pulmonary effort is normal. No respiratory distress.  Abdominal:     Palpations: Abdomen is soft.     Tenderness: There is no abdominal tenderness. There is no guarding or rebound.   Musculoskeletal:     Cervical back: Normal range of motion. No rigidity.  Skin:    General: Skin is warm and dry.  Neurological:     General: No focal deficit present.     Comments: Moving all extremities.  Patient confused and has trouble following simple commands.  I do not appreciate any obvious facial droop.  Aware to person only.     ED Results / Procedures / Treatments   Labs (all labs ordered are listed, but only abnormal results are displayed) Labs Reviewed  COMPREHENSIVE METABOLIC PANEL - Abnormal; Notable for the following components:      Result Value   Glucose, Bld 158 (*)    Creatinine, Ser 1.16 (*)    GFR, Estimated 49 (*)    All other components within normal limits  CBC WITH DIFFERENTIAL/PLATELET - Abnormal; Notable for the following components:   Platelets 135 (*)    All other components within normal limits  URINALYSIS, W/ REFLEX TO CULTURE (INFECTION SUSPECTED) - Abnormal; Notable for the following components:   Specific Gravity, Urine >1.046 (*)    Protein, ur TRACE (*)    Leukocytes,Ua SMALL (*)    Non Squamous Epithelial 0-5 (*)    All other components within normal limits  RESP PANEL BY RT-PCR (RSV, FLU A&B, COVID)  RVPGX2  CULTURE, BLOOD (ROUTINE X 2)  CULTURE, BLOOD (ROUTINE X 2)  URINE CULTURE  CSF CULTURE W GRAM STAIN  HSV CULTURE AND TYPING  RESP PANEL BY RT-PCR (RSV, FLU A&B, COVID)  RVPGX2  LACTIC ACID, PLASMA  CK  PROTIME-INR  APTT  AMMONIA  CSF CELL COUNT WITH DIFFERENTIAL  PROTEIN AND GLUCOSE, CSF  CRYPTOCOCCAL ANTIGEN, CSF  5 HIAA, QUANTITATIVE, URINE, 24 HOUR    EKG EKG Interpretation Date/Time:  Friday May 18 2023 23:25:00 EDT Ventricular Rate:  68 PR Interval:  170 QRS Duration:  84 QT Interval:  389 QTC Calculation: 414 R Axis:   63  Text Interpretation: Sinus rhythm Confirmed by Vanetta Mulders [phone]) on 05/18/2023 11:39:47 PM  Radiology CT CHEST ABDOMEN PELVIS W CONTRAST  Result Date: 05/18/2023 CLINICAL DATA:   Vomiting. Concern for bowel obstruction. History of neuroendocrine tumor. EXAM: CT CHEST, ABDOMEN, AND PELVIS WITH CONTRAST TECHNIQUE: Multidetector CT imaging of the chest, abdomen and pelvis was performed following the standard protocol during bolus administration of intravenous contrast. RADIATION DOSE REDUCTION: This exam was performed according to the departmental dose-optimization program which includes automated exposure control, adjustment of the mA and/or kV according to patient size and/or use of iterative reconstruction technique. CONTRAST:  OMNIPAQUE IOHEXOL 300 MG/ML  SOLN COMPARISON:  Chest radiograph dated 05/18/2023. FINDINGS: CT CHEST FINDINGS Cardiovascular: There is no cardiomegaly or pericardial effusion. The thoracic aorta is unremarkable. The central pulmonary arteries appear patent. Mediastinum/Nodes: There is no hilar or mediastinal  adenopathy. There is a small hiatal hernia. The esophagus is grossly unremarkable. There is a 2.4 cm nodule inferior to the left thyroid lobe. Further evaluation with ultrasound recommended. No mediastinal fluid collection. Lungs/Pleura: Minimal bibasilar dependent atelectasis. No focal consolidation, pleural effusion, or pneumothorax. The central airways are patent. Musculoskeletal: No acute osseous pathology. CT ABDOMEN PELVIS FINDINGS No intra-abdominal free air or free fluid. Hepatobiliary: Fatty liver. No biliary ductal dilatation. Multiple stones in the gallbladder. No pericholecystic fluid or evidence of acute cholecystitis by CT. Ultrasound may provide better evaluation of the gallbladder if there is high clinical concern for acute cholecystitis. Pancreas: The pancreas is unremarkable. Spleen: Normal in size without focal abnormality. Adrenals/Urinary Tract: The adrenal glands unremarkable. There is no hydronephrosis on either side. There is symmetric enhancement and excretion of contrast by both kidneys. The visualized ureters and urinary bladder  appear unremarkable. Stomach/Bowel: Small hiatal hernia. There is postsurgical changes of the bowel with anastomotic suture in the right upper abdomen. There is no bowel obstruction or active inflammation. There is loose stool in the colon. Somewhat of formed stool noted in the distal colon. Appendectomy. Vascular/Lymphatic: Mild aortoiliac atherosclerotic disease. The IVC is unremarkable. No portal venous gas. There is no adenopathy. Reproductive: The uterus is anteverted.  No adnexal masses. Other: Midline vertical anterior abdominal wall incisional scar. There is diastasis of anterior abdominal wall musculature with a broad based shallow hernia. Musculoskeletal: Degenerative changes of the spine. No acute osseous pathology. IMPRESSION: 1. No acute intrathoracic, abdominal, or pelvic pathology. 2. No bowel obstruction. 3. Fatty liver. 4. Cholelithiasis. 5. A 2.4 cm nodule inferior to the left thyroid lobe. Recommend thyroid US (ref: J Am Coll Radiol. 2015 Feb;12(2): 143-50). 6.  Aortic Atherosclerosis (ICD10-I70.0). Electronically Signed   By: Elgie Collard M.D.   On: 05/18/2023 22:11   CT Head Wo Contrast  Result Date: 05/18/2023 CLINICAL DATA:  Mental status change, unknown cause h/o neuroendocrine tumor EXAM: CT HEAD WITHOUT CONTRAST TECHNIQUE: Contiguous axial images were obtained from the base of the skull through the vertex without intravenous contrast. RADIATION DOSE REDUCTION: This exam was performed according to the departmental dose-optimization program which includes automated exposure control, adjustment of the mA and/or kV according to patient size and/or use of iterative reconstruction technique. COMPARISON:  None Available. FINDINGS: Brain: No evidence of large-territorial acute infarction. No parenchymal hemorrhage. No mass lesion. No extra-axial collection. No mass effect or midline shift. No hydrocephalus. Basilar cisterns are patent. Vascular: No hyperdense vessel. Skull: No acute  fracture or focal lesion. Sinuses/Orbits: Paranasal sinuses and mastoid air cells are clear. The orbits are unremarkable. Other: None. IMPRESSION: No acute intracranial abnormality. Electronically Signed   By: Tish Frederickson M.D.   On: 05/18/2023 22:03   DG Chest Port 1 View  Result Date: 05/18/2023 CLINICAL DATA:  Fever EXAM: PORTABLE CHEST 1 VIEW COMPARISON:  None Available. FINDINGS: Heart is mildly enlarged. Mediastinal contours within normal limits. No confluent airspace opacities or effusions. No acute bony abnormality. IMPRESSION: Mild cardiomegaly.  No active disease. Electronically Signed   By: Charlett Nose M.D.   On: 05/18/2023 21:32    Procedures Procedures   Medications Ordered in ED Medications  lactated ringers infusion ( Intravenous New Bag/Given 05/19/23 0147)  vancomycin (VANCOREADY) IVPB 1500 mg/300 mL (has no administration in time range)  ibuprofen (ADVIL) 200 MG tablet (  Not Given 05/19/23 0148)  ibuprofen (ADVIL) 400 MG tablet (  Not Given 05/19/23 0148)  acetaminophen (TYLENOL) tablet 1,000 mg (1,000 mg  Oral Given 05/18/23 2032)  ondansetron [facility]) injection 4 mg (4 mg Intravenous Given 05/18/23 2032)  lactated ringers bolus 1,000 mL (0 mLs Intravenous Stopped 05/18/23 2306)  lactated ringers bolus 1,000 mL (0 mLs Intravenous Stopped 05/19/23 0001)    And  lactated ringers bolus 1,000 mL (0 mLs Intravenous Stopped 05/19/23 0032)    And  lactated ringers bolus 500 mL (0 mLs Intravenous Stopped 05/19/23 0146)  iohexol (OMNIPAQUE) 300 MG/ML solution 100 mL (100 mLs Intravenous Contrast Given 05/18/23 2151)  metroNIDAZOLE (FLAGYL) IVPB 500 mg (0 mg Intravenous Stopped 05/19/23 0001)  vancomycin (VANCOCIN) IVPB 1000 mg/200 mL premix (0 mg Intravenous Stopped 05/19/23 0138)    Followed by  vancomycin (VANCOCIN) 750 mg in sodium chloride 0.9 % 250 mL IVPB (0 mg Intravenous Stopped 05/19/23 0246)  ondansetron (ZOFRAN) injection 4 mg (4 mg Intravenous Given 05/19/23 0031)   HYDROmorphone (DILAUDID) injection 1 mg (1 mg Intravenous Given 05/19/23 0030)  ibuprofen (ADVIL) tablet 600 mg (600 mg Oral Given 05/19/23 0145)  cefTRIAXone (ROCEPHIN) 2 g in sodium chloride 0.9 % 100 mL IVPB (0 g Intravenous Stopped 05/19/23 0344)    ED Course/ Medical Decision Making/ A&P                            Medical Decision Making Amount and/or Complexity of Data Reviewed Labs: ordered. Radiology: ordered. ECG/medicine tests: ordered.  Risk OTC drugs. Prescription drug management. Decision regarding hospitalization.   77 y.o. female presents to the ER for evaluation of headache, AMS, speech problem/confusion, nausea, vomiting, fever. Differential diagnosis includes but is not limited to sepsis, meningitis, intra-abdominal etiology, intracranial etiology, carcinoid syndrome, serotonin syndrome, dehydration,Drug-related, hypoxia, hyper/hypoglycemia, encephalopathy, sepsis, DKA/HHS, brain lesion, CVA, seizure, environmental, psychiatric. Vital signs febrile at 104.1 Fahrenheit, otherwise normotensive, normal pulse rate, satting well room air without increased work of breathing. Physical exam as noted above.   Sepsis workup was initiated based on patient's vital signs.  I discussed this patient with my attending who assessed at bedside.  Recommended imaging as well as lumbar puncture to rule out any meningitis.  He ordered broad-spectrum antibiotics at this time for coverage.  Tylenol was given by nursing staff in triage for fever.  Fluids ordered as well.  I independently reviewed and interpreted the patient's labs.  CMP shows glucose at 158, creatinine is elevated 1.16 from 0.88 yesterday.  No other electrolyte or LFT abnormality.  CBC shows no leukocytosis or anemia.  Platelets show slight normocytic anemia although fairly around patient's baseline.  Lactic acid normal limits.  CK, APTT, PT, INR within normal limits.  Ammonia within normal limits.  Urinalysis shows concentrated  urine with trace amount of protein.  A small amount of leukocytes present with 11-20 white blood cells but no bacteria seen.  There is 0-5 nonsensical bili was present.  Urine culture pending.  Blood cultures pending.  CT head shows no acute intracranial abnormality per radiology read.  CT chest on pelvis with contrast shows  1. No acute intrathoracic, abdominal, or pelvic pathology. 2. No bowel obstruction. 3. Fatty liver. 4. Cholelithiasis. 5. A 2.4 cm nodule inferior to the left thyroid lobe. Recommend thyroid US (ref: J Am Coll Radiol. 2015 Feb;12(2): 143-50). 6.  Aortic Atherosclerosis.  Per radiology read.  Chest x-ray shows mild cardiomegaly, no active disease.  Per radiology read.  Please see my attending note for lumbar puncture.  Lumbar puncture was given by my attending however was unsuccessful.  Will possibly need lower puncture while inpatient neurology meningitis causing the patient's altered mental status.  I considered other reasons for altered mental status, she does appear to be more awake and alert after fluids, could still be some dehydration causing her altered mental status.  Her CK is normal.  She is still confused however is oriented to person and place now.  Her speech appears clear as well.  Again, CT of the head is unremarkable.  Will likely need MRI with admission.  Question urinary tract infection as well given urine.  Will await urine culture.  Patient is having nausea and vomiting however denies any abdominal pain.  She reports her diarrhea is chronic given her condition however has not worsened.  Question carcinoid syndrome given her history of neuroendocrine disorders however she denies any chest pain or shortness of breath.  She is not hypotensive.  I did order the 24-hour HIAA urine.  Question possible serotonin syndrome.  Discussed with my attending, likely be considered meningitis.  Regardless, patient will need admission at this time.  Patient and family is amenable to  this.  [facility] hospital[facility] to admit.  Portions of this report may have been transcribed using voice recognition software. Every effort was made to ensure accuracy; however, inadvertent computerized transcription errors may be present.   I discussed this case with my attending physician who cosigned this note including patient's presenting symptoms, physical exam, and planned diagnostics and interventions. Attending physician stated agreement with plan or made changes to plan which were implemented.   Attending physician assessed patient at bedside.  Final Clinical Impression(s) / ED Diagnoses Final diagnoses:  Meningitis  Altered mental status, unspecified altered mental status type  Fever in other diseases  Dehydration  Nausea and vomiting, unspecified vomiting type    Rx / DC Orders ED Discharge Orders     None         Achille Rich, PA-C 05/19/23 0350    Vanetta Mulders, MD 05/21/23 [phone]

## 2023-05-19 NOTE — Progress Notes (Signed)
   Patient Name: Anna Cortez, malcolm DOB: 1946/04/02 MRN: [id number] Transferring facility: [facility] Requesting provider: ransom, PA Reason for transfer: fever, AMS 77 yo WF with hx of neuroendocrine tumor(appendix), presents to ER with N/V, fever, headache.  febrile to 04.1. SCr 1.1 increased from 0.88 yesterday. pt is confused. no reliable HPI.  failed LP in ER.  CT chest/abd/pelvis was negative. CT head negative. given cefepime/vanco.  asked EDP to give 2 g IV Rocephin. lactic acid 1.5. WBC 9.2. moved to [place] in the last 6 weeks. was in [place] prior to moving to [place] Going to: [facility] Admission Status: obs Bed Type: med/surg To Do: may need IR LP. ?? neuro consult.  [facility] will assume care on arrival to accepting facility. Until arrival, medical decision making responsibilities remain with the EDP.  However, [facility] available 24/7 for questions and assistance.   Nursing staff please page [facility] Admits and Consults [phone]) as soon as the patient arrives to the hospital.  Carollee Herter, DO [facility]

## 2023-05-19 NOTE — Progress Notes (Signed)
Pharmacy Antibiotic Note  Anna Cortez is a 77 y.o. 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  Pharmacy has been consulted for acyclovir dosing.  WBC 8.5, Scr 1.04 (CrCl 55 mL/min). Concern for possible encephalitis given fevers and severe headache with waxing/waning mental status per notes. Currently on vancomycin and ceftriaxone.   Plan: Start acyclovir 875 mg (10 mg/kg) every 8 hours  Will order LR at 125 mL/hr  Monitor renal fx, cx results, clinical pic   Height: 5\' 9"  (175.3 cm) Weight: 87.7 kg (193 lb 5.5 oz) IBW/kg (Calculated) : 66.2  Temp (24hrs), Avg:100.8 F (38.2 C), Min:98.2 F (36.8 C), Max:104.1 F (40.1 C)  Recent Labs  Lab 05/17/23 1522 05/18/23 2033 05/19/23 1013 05/19/23 1759  WBC 8.6 9.2  --  8.5  CREATININE 0.88 1.16* 0.69  --   LATICACIDVEN  --  1.5  --   --     Estimated Creatinine Clearance: 71.7 mL/min (by C-G formula based on SCr of 0.69 mg/dL).    Allergies  Allergen Reactions   Penicillins    Prochlorperazine Edisylate    Tolectin [Tolmetin Sodium]     Antimicrobials this admission: Cefepime/metronidazole 7/19 x1 Ceftriaxone 7/20 >>  Vancomycin 7/19 >>   Acyclovir 7/20 >>   Dose adjustments this admission: N/A  Microbiology results: 7/19 BCx: ngtd 7/19 UCx: sent   Thank you for allowing pharmacy to participate in this patient's care,  Sherron Monday, PharmD, BCCCP Clinical Pharmacist  Phone: [phone] 05/19/2023 6:59 PM  Please check AMION for all [facility] Pharmacy phone numbers After 10:00 PM, call Main Pharmacy [phone]

## 2023-05-19 NOTE — H&P (Signed)
History and Physical   Anna Cortez:[id number] DOB: May 28, 1946 DOA: 05/18/2023  PCP: Edwyna Perfect, MD (Inactive)   Patient coming from: Home  Chief Complaint: Nausea vomiting headache, altered mental status  HPI: Anna Cortez is a 77 y.o. female with medical history significant of anxiety, depression, hypertension, hyperlipidemia, GERD, CKD 3, DDD, carcinoid tumor status post lutathera presenting with nausea, vomiting, headache, altered mental status.  Patient presented a couple days ago with nausea vomiting and headache.  Improved with Tylenol, Valium, antiemetic, IV fluids and patient was discharged home.  At home nausea vomiting and headache returned without improvement with Zofran and Valium at home.  Unable to tolerate p.o.  Again to have speech abnormalities with slurring and other changes around 2 PM yesterday and family brought her back to the ED.  Patient denies chest pain, shortness of breath, abdominal pain, constipation, diarrhea.  ED Course: Vital signs in the ED notable for blood pressure in the 100s to 140 systolic, heart rate in the 40s to 60s, fever to 101.9.  Lab workup included CMP with creatinine of 1.16, glucose 158.  CBC with platelets 135.  PT, PTT, INR normal.  Lactic acid normal.  CK normal.  Respiratory panel for flu COVID RSV negative.  Urinalysis with protein and leukocytes only.  Urine culture pending.  Ammonia level normal.  Blood culture pending.  Chest x-ray, CT head, CT chest abdomen and pelvis were all without acute abnormality.  Patient started on vancomycin, cefepime, Flagyl in the ED.  Ceftriaxone added on later.  Patient received Zofran, Tylenol, Dilaudid, Advil.  LP was attempted but unsuccessful.  Sister reports waxing and waning mentation.  With slurred speech at times.  Was more clear and near baseline prior to receiving pain medication.  Review of Systems: As per HPI otherwise all other systems reviewed and are negative.  Past Medical  History:  Diagnosis Date   Degenerative disc disease    Insomnia    Osteoarthritis     Past Surgical History:  Procedure Laterality Date   KNEE SURGERY     Right Knee   TUBAL LIGATION     WISDOM TOOTH EXTRACTION      Social History  reports that she has never smoked. She has never used smokeless tobacco. She reports current alcohol use. She reports that she does not use drugs.  Allergies  Allergen Reactions   Penicillins    Prochlorperazine Edisylate    Tolectin [Tolmetin Sodium]     Family History  Problem Relation Age of Onset   Heart disease Neg Hx    Prostate cancer Father    Colon cancer Neg Hx    Breast cancer Neg Hx    Hypertension Neg Hx    Diabetes Neg Hx    Hyperlipidemia Neg Hx   Reviewed on admission  Prior to Admission medications   Medication Sig Start Date End Date Taking? Authorizing Provider  diazepam (VALIUM) 5 MG tablet Take 1 tablet (5 mg total) by mouth 2 (two) times daily. 05/17/23   Glyn Ade, MD  estradiol (ESTRACE) 0.5 MG tablet Take 1 tablet (0.5 mg total) by mouth daily. 05/16/11 05/15/12  Damian Leavell., MD  fluticasone [facility]) 50 MCG/ACT nasal spray Place 2 sprays into the nose daily. 12/22/11 12/21/12  Edwyna Perfect, MD  ondansetron (ZOFRAN-ODT) 4 MG disintegrating tablet Take 1 tablet (4 mg total) by mouth every 8 (eight) hours as needed for nausea or vomiting. 05/17/23   Teressa Lower, PA-C  Physical Exam: Vitals:   05/19/23 1300 05/19/23 1315 05/19/23 1345 05/19/23 1550  BP: (!) 141/68   (!) 132/56  Pulse: 63 67 63 (!) 52  Resp: 18 17 (!) 21   Temp:    99.9 F (37.7 C)  TempSrc:    Oral  SpO2: 97% 98% 98% 100%  Weight:    87.7 kg  Height:    5\' 9"  (1.753 m)    Physical Exam Constitutional:      General: She is not in acute distress.    Appearance: Normal appearance.  HENT:     Head: Normocephalic and atraumatic.     Mouth/Throat:     Mouth: Mucous membranes are moist.     Pharynx: Oropharynx is  clear.  Eyes:     Extraocular Movements: Extraocular movements intact.     Pupils: Pupils are equal, round, and reactive to light.  Cardiovascular:     Rate and Rhythm: Normal rate and regular rhythm.     Pulses: Normal pulses.     Heart sounds: Normal heart sounds.  Pulmonary:     Effort: Pulmonary effort is normal. No respiratory distress.     Breath sounds: Normal breath sounds.  Abdominal:     General: Bowel sounds are normal. There is no distension.     Palpations: Abdomen is soft.     Tenderness: There is no abdominal tenderness.  Musculoskeletal:        General: No swelling or deformity.  Skin:    General: Skin is warm and dry.  Neurological:     General: No focal deficit present.     Mental Status: Mental status is at baseline.    Labs on Admission: I have personally reviewed following labs and imaging studies  CBC: Recent Labs  Lab 05/17/23 1522 05/18/23 2033  WBC 8.6 9.2  NEUTROABS  --  7.1  HGB 14.0 13.0  HCT 40.2 37.7  MCV 96.4 96.9  PLT 164 135*    Basic Metabolic Panel: Recent Labs  Lab 05/17/23 1522 05/18/23 2033 05/19/23 1013  NA 136 138  --   K 4.2 3.7  --   CL 103 103  --   CO2 20* 25  --   GLUCOSE 129* 158*  --   BUN 17 21  --   CREATININE 0.88 1.16* 0.69  CALCIUM 9.4 9.0  --     GFR: Estimated Creatinine Clearance: 71.7 mL/min (by C-G formula based on SCr of 0.69 mg/dL).  Liver Function Tests: Recent Labs  Lab 05/17/23 1522 05/18/23 2033  AST 19 20  ALT 16 16  ALKPHOS 76 53  BILITOT 1.0 0.9  PROT 7.5 7.0  ALBUMIN 4.4 4.5    Urine analysis:    Component Value Date/Time   COLORURINE YELLOW 05/18/2023 2309   APPEARANCEUR CLEAR 05/18/2023 2309   LABSPEC >1.046 (H) 05/18/2023 2309   PHURINE 6.0 05/18/2023 2309   GLUCOSEU NEGATIVE 05/18/2023 2309   HGBUR NEGATIVE 05/18/2023 2309   BILIRUBINUR NEGATIVE 05/18/2023 2309   BILIRUBINUR n 05/02/2011 1025   KETONESUR NEGATIVE 05/18/2023 2309   PROTEINUR TRACE (A) 05/18/2023  2309   UROBILINOGEN 0.2 05/02/2011 1025   NITRITE NEGATIVE 05/18/2023 2309   LEUKOCYTESUR SMALL (A) 05/18/2023 2309    Radiological Exams on Admission: CT CHEST ABDOMEN PELVIS W CONTRAST  Result Date: 05/18/2023 CLINICAL DATA:  Vomiting. Concern for bowel obstruction. History of neuroendocrine tumor. EXAM: CT CHEST, ABDOMEN, AND PELVIS WITH CONTRAST TECHNIQUE: Multidetector CT imaging of the chest, abdomen and  pelvis was performed following the standard protocol during bolus administration of intravenous contrast. RADIATION DOSE REDUCTION: This exam was performed according to the departmental dose-optimization program which includes automated exposure control, adjustment of the mA and/or kV according to patient size and/or use of iterative reconstruction technique. CONTRAST:  OMNIPAQUE IOHEXOL 300 MG/ML  SOLN COMPARISON:  Chest radiograph dated 05/18/2023. FINDINGS: CT CHEST FINDINGS Cardiovascular: There is no cardiomegaly or pericardial effusion. The thoracic aorta is unremarkable. The central pulmonary arteries appear patent. Mediastinum/Nodes: There is no hilar or mediastinal adenopathy. There is a small hiatal hernia. The esophagus is grossly unremarkable. There is a 2.4 cm nodule inferior to the left thyroid lobe. Further evaluation with ultrasound recommended. No mediastinal fluid collection. Lungs/Pleura: Minimal bibasilar dependent atelectasis. No focal consolidation, pleural effusion, or pneumothorax. The central airways are patent. Musculoskeletal: No acute osseous pathology. CT ABDOMEN PELVIS FINDINGS No intra-abdominal free air or free fluid. Hepatobiliary: Fatty liver. No biliary ductal dilatation. Multiple stones in the gallbladder. No pericholecystic fluid or evidence of acute cholecystitis by CT. Ultrasound may provide better evaluation of the gallbladder if there is high clinical concern for acute cholecystitis. Pancreas: The pancreas is unremarkable. Spleen: Normal in size without  focal abnormality. Adrenals/Urinary Tract: The adrenal glands unremarkable. There is no hydronephrosis on either side. There is symmetric enhancement and excretion of contrast by both kidneys. The visualized ureters and urinary bladder appear unremarkable. Stomach/Bowel: Small hiatal hernia. There is postsurgical changes of the bowel with anastomotic suture in the right upper abdomen. There is no bowel obstruction or active inflammation. There is loose stool in the colon. Somewhat of formed stool noted in the distal colon. Appendectomy. Vascular/Lymphatic: Mild aortoiliac atherosclerotic disease. The IVC is unremarkable. No portal venous gas. There is no adenopathy. Reproductive: The uterus is anteverted.  No adnexal masses. Other: Midline vertical anterior abdominal wall incisional scar. There is diastasis of anterior abdominal wall musculature with a broad based shallow hernia. Musculoskeletal: Degenerative changes of the spine. No acute osseous pathology. IMPRESSION: 1. No acute intrathoracic, abdominal, or pelvic pathology. 2. No bowel obstruction. 3. Fatty liver. 4. Cholelithiasis. 5. A 2.4 cm nodule inferior to the left thyroid lobe. Recommend thyroid US (ref: J Am Coll Radiol. 2015 Feb;12(2): 143-50). 6.  Aortic Atherosclerosis (ICD10-I70.0). Electronically Signed   By: Elgie Collard M.D.   On: 05/18/2023 22:11   CT Head Wo Contrast  Result Date: 05/18/2023 CLINICAL DATA:  Mental status change, unknown cause h/o neuroendocrine tumor EXAM: CT HEAD WITHOUT CONTRAST TECHNIQUE: Contiguous axial images were obtained from the base of the skull through the vertex without intravenous contrast. RADIATION DOSE REDUCTION: This exam was performed according to the departmental dose-optimization program which includes automated exposure control, adjustment of the mA and/or kV according to patient size and/or use of iterative reconstruction technique. COMPARISON:  None Available. FINDINGS: Brain: No evidence of  large-territorial acute infarction. No parenchymal hemorrhage. No mass lesion. No extra-axial collection. No mass effect or midline shift. No hydrocephalus. Basilar cisterns are patent. Vascular: No hyperdense vessel. Skull: No acute fracture or focal lesion. Sinuses/Orbits: Paranasal sinuses and mastoid air cells are clear. The orbits are unremarkable. Other: None. IMPRESSION: No acute intracranial abnormality. Electronically Signed   By: Tish Frederickson M.D.   On: 05/18/2023 22:03   DG Chest Port 1 View  Result Date: 05/18/2023 CLINICAL DATA:  Fever EXAM: PORTABLE CHEST 1 VIEW COMPARISON:  None Available. FINDINGS: Heart is mildly enlarged. Mediastinal contours within normal limits. No confluent airspace opacities or effusions.  No acute bony abnormality. IMPRESSION: Mild cardiomegaly.  No active disease. Electronically Signed   By: Charlett Nose M.D.   On: 05/18/2023 21:32    EKG: Independently reviewed.  Rhythm at 68 bpm.  Baseline artifact nonspecific ST changes minimal baseline wander.  Assessment/Plan Principal Problem:   Altered mental status Active Problems:   Anxiety state   Essential hypertension   GERD (gastroesophageal reflux disease)   Hyperlipidemia   Carcinoid tumor of appendix   Severe episode of recurrent major depressive disorder, without psychotic features (HCC)   Stage 3 chronic kidney disease (HCC)   Altered mental status Headache Leukocytosis Fever > Patient presenting with fever, headache, leukocytosis and some changes in speech. > CT head negative in the ED.  Negative CT chest abdomen pelvis as well. > Concern for possible encephalitis/meningitis.  Started on ceftriaxone and cefepime. > Unable to get LP at outside ED. > Somewhat less suspicious for meningitis given lack of nuchal rigidity on exam, could still be a component of encephalitis given fevers and severe headache with waxing and waning mental status.  Will add on viral coverage as well as MRI for closer  evaluation of brain for alternative explanations and signs of viral encephalitis. - Monitor on telemetry - Continue with ceftriaxone and vancomycin - Holding off on ampicillin for Listeria coverage given penicillin allergy, will need to start this if patient's condition worsens - Will add on acyclovir for viral coverage - Attempt to get IR LP if able - Follow-up urine culture, blood culture - Trend fever curve and WBC - Add on full respiratory viral panel given flu COVID RSV is negative. - Consider MRI MR brain - Supportive care  CKD 3 > Creatinine stable in the ED - Trend renal function and electrolytes  History of carcinoid tumor > Status post radiotherapy with lutathera  > Now following with [facility] for this per family  Hypertension Hyperlipidemia GERD - History of the above but not currently on any medications  DVT prophylaxis: Lovenox Code Status:   Full Family Communication:  Sister updated at bedside  Disposition Plan:   Patient is from:  Home  Anticipated DC to:  Home  Anticipated DC date:  1 to 4 days  Anticipated DC barriers: None  Consults called:  None Admission status:  Observation, telemetry  Severity of Illness: The appropriate patient status for this patient is OBSERVATION. Observation status is judged to be reasonable and necessary in order to provide the required intensity of service to ensure the patient's safety. The patient's presenting symptoms, physical exam findings, and initial radiographic and laboratory data in the context of their medical condition is felt to place them at decreased risk for further clinical deterioration. Furthermore, it is anticipated that the patient will be medically stable for discharge from the hospital within 2 midnights of admission.    Synetta Fail MD [facility]  How to contact the [facility] An Attending or Consulting provider 7A - 7P or covering provider during after hours 7P -7A, for this patient?   Check  the care team in [facility] and look for a) attending/consulting [facility] provider listed and b) the [facility] team listed Log into [email or web address] and use [place]'s universal password to access. If you do not have the password, please contact the hospital operator. Locate the [facility] provider you are looking for under [facility] and page to a number that you can be directly reached. If you still have difficulty reaching the provider, please page the [facility] (Director on  Call) for the Hospitalists listed on amion for assistance.  05/19/2023, 5:01 PM

## 2023-05-19 NOTE — ED Notes (Signed)
Called [facility] for transport @11 :57am.  Spoke with [employer]

## 2023-05-19 NOTE — Progress Notes (Addendum)
Pharmacy Antibiotic Note  Anna Cortez is a 77 y.o. female admitted on 05/18/2023 with sepsis.  Pharmacy has been consulted for Cefepime and Vancomycin dosing.  WBC 9.2, Tmax 104.1 LA 1.5, HR 58, RR 21 SCr 1.16 - no labs this AM  ADDENDUM TO PLAN #2 05/19/23 4:58 PM: Rocephin continued per MD (Meningitis dosing) Holding listeria coverage for now per [facility] given PCN allergy in chart --if condition worsens may need to adjust abx to include listeria coverage (Would consider vancomycin + meropenem in that instance) Adjusting vancomycin to 1000mg  q12h as AUC dosing is not recommended in for CNS infection  Delmar Landau, PharmD, BCPS 05/19/2023 5:12 PM ED Clinical Pharmacist -  [phone]  ADDENDUM TO PLAN 05/19/23 11:20 AM: Scr resulting at 0.69 Will adjust Vancomycin back to 1500mg  q24h (eAUC 447.1) TRH note from last night not clear if resuming vancomycin or note will wait for TRH note today to see if vancomycin planned to continue.  Delmar Landau, PharmD, BCPS 05/19/2023 11:20 AM ED Clinical Pharmacist -  [phone]   Plan: Getting Scr to see if renal function remains stable. Cefepime changed to Rocephin per [facility] request (only a one time dose given 7/20 ~0200) Vancomycin loaded at 1750 mg and originally planned for 1500mg  q24. Given Scr 1.16 and IBW for dosing/calculations will adjust dose to 1250mg  q24 for eAUC 522.1. TRH note from last night not clear if resuming vancomycin or note will wait for TRH note today to see if vancomycin planned to continue.    > Goal AUC 400-550    > Check vancomycin levels at steady state  Monitor daily CBC, temp, SCr, and for clinical signs of improvement  F/u cultures and de-escalate antibiotics as able     Temp (24hrs), Avg:101.2 F (38.4 C), Min:98.2 F (36.8 C), Max:104.1 F (40.1 C)  Recent Labs  Lab 05/17/23 1522 05/18/23 2033  WBC 8.6 9.2  CREATININE 0.88 1.16*  LATICACIDVEN  --  1.5    Estimated Creatinine Clearance: 47.9  mL/min (A) (by C-G formula based on SCr of 1.16 mg/dL (H)).    Allergies  Allergen Reactions   Penicillins    Prochlorperazine Edisylate    Tolectin [Tolmetin Sodium]     Antimicrobials this admission: Cefepime 7/19 >> 7/20 Rocephin 7/20 x 1 in ED Vancomycin 7/19 >>  Metronidazole 7/19 x1  Dose adjustments this admission: N/A  Microbiology results: 7/19 BCx: sent   Delmar Landau, PharmD, BCPS 05/19/2023 9:54 AM ED Clinical Pharmacist -  [phone]

## 2023-05-20 ENCOUNTER — Inpatient Hospital Stay (HOSPITAL_COMMUNITY): Payer: Medicare Other

## 2023-05-20 DIAGNOSIS — R4182 Altered mental status, unspecified: Secondary | ICD-10-CM

## 2023-05-20 DIAGNOSIS — I10 Essential (primary) hypertension: Secondary | ICD-10-CM

## 2023-05-20 DIAGNOSIS — F411 Generalized anxiety disorder: Secondary | ICD-10-CM | POA: Diagnosis not present

## 2023-05-20 DIAGNOSIS — F332 Major depressive disorder, recurrent severe without psychotic features: Secondary | ICD-10-CM | POA: Diagnosis present

## 2023-05-20 DIAGNOSIS — Z880 Allergy status to penicillin: Secondary | ICD-10-CM | POA: Diagnosis not present

## 2023-05-20 DIAGNOSIS — G9341 Metabolic encephalopathy: Secondary | ICD-10-CM | POA: Diagnosis not present

## 2023-05-20 DIAGNOSIS — C7B8 Other secondary neuroendocrine tumors: Secondary | ICD-10-CM | POA: Diagnosis not present

## 2023-05-20 DIAGNOSIS — Z923 Personal history of irradiation: Secondary | ICD-10-CM | POA: Diagnosis not present

## 2023-05-20 DIAGNOSIS — E860 Dehydration: Secondary | ICD-10-CM | POA: Diagnosis present

## 2023-05-20 DIAGNOSIS — N179 Acute kidney failure, unspecified: Secondary | ICD-10-CM | POA: Diagnosis present

## 2023-05-20 DIAGNOSIS — E785 Hyperlipidemia, unspecified: Secondary | ICD-10-CM | POA: Diagnosis present

## 2023-05-20 DIAGNOSIS — N183 Chronic kidney disease, stage 3 unspecified: Secondary | ICD-10-CM

## 2023-05-20 DIAGNOSIS — E340 Carcinoid syndrome: Secondary | ICD-10-CM | POA: Diagnosis not present

## 2023-05-20 DIAGNOSIS — D3A020 Benign carcinoid tumor of the appendix: Secondary | ICD-10-CM

## 2023-05-20 DIAGNOSIS — I129 Hypertensive chronic kidney disease with stage 1 through stage 4 chronic kidney disease, or unspecified chronic kidney disease: Secondary | ICD-10-CM | POA: Diagnosis present

## 2023-05-20 DIAGNOSIS — Z888 Allergy status to other drugs, medicaments and biological substances status: Secondary | ICD-10-CM | POA: Diagnosis not present

## 2023-05-20 DIAGNOSIS — K219 Gastro-esophageal reflux disease without esophagitis: Secondary | ICD-10-CM

## 2023-05-20 DIAGNOSIS — C7B00 Secondary carcinoid tumors, unspecified site: Secondary | ICD-10-CM | POA: Diagnosis not present

## 2023-05-20 DIAGNOSIS — Z79899 Other long term (current) drug therapy: Secondary | ICD-10-CM | POA: Diagnosis not present

## 2023-05-20 DIAGNOSIS — N1832 Chronic kidney disease, stage 3b: Secondary | ICD-10-CM | POA: Diagnosis present

## 2023-05-20 DIAGNOSIS — Z1152 Encounter for screening for COVID-19: Secondary | ICD-10-CM | POA: Diagnosis not present

## 2023-05-20 DIAGNOSIS — R4781 Slurred speech: Secondary | ICD-10-CM | POA: Diagnosis present

## 2023-05-20 DIAGNOSIS — A879 Viral meningitis, unspecified: Secondary | ICD-10-CM | POA: Diagnosis present

## 2023-05-20 LAB — COMPREHENSIVE METABOLIC PANEL
ALT: 26 U/L (ref 0–44)
AST: 37 U/L (ref 15–41)
Albumin: 3 g/dL — ABNORMAL LOW (ref 3.5–5.0)
Alkaline Phosphatase: 48 U/L (ref 38–126)
Anion gap: 13 (ref 5–15)
BUN: 16 mg/dL (ref 8–23)
CO2: 24 mmol/L (ref 22–32)
Calcium: 8.1 mg/dL — ABNORMAL LOW (ref 8.9–10.3)
Chloride: 99 mmol/L (ref 98–111)
Creatinine, Ser: 1.05 mg/dL — ABNORMAL HIGH (ref 0.44–1.00)
GFR, Estimated: 55 mL/min — ABNORMAL LOW (ref >60)
Glucose, Bld: 107 mg/dL — ABNORMAL HIGH (ref 70–99)
Potassium: 3.9 mmol/L (ref 3.5–5.1)
Sodium: 136 mmol/L (ref 135–145)
Total Bilirubin: 1 mg/dL (ref 0.3–1.2)
Total Protein: 5.5 g/dL — ABNORMAL LOW (ref 6.5–8.1)

## 2023-05-20 LAB — CBC
HCT: 34.3 % — ABNORMAL LOW (ref 36.0–46.0)
Hemoglobin: 11.5 g/dL — ABNORMAL LOW (ref 12.0–15.0)
MCH: 33 pg (ref 26.0–34.0)
MCHC: 33.5 g/dL (ref 30.0–36.0)
MCV: 98.6 fL (ref 80.0–100.0)
Platelets: 103 10*3/uL — ABNORMAL LOW (ref 150–400)
RBC: 3.48 MIL/uL — ABNORMAL LOW (ref 3.87–5.11)
RDW: 12 % (ref 11.5–15.5)
WBC: 8.6 10*3/uL (ref 4.0–10.5)
nRBC: 0 % (ref 0.0–0.2)

## 2023-05-20 LAB — URINE CULTURE: Culture: <10000 — AB

## 2023-05-20 MED ORDER — CARMEX CLASSIC LIP BALM EX OINT
1.0000 | TOPICAL_OINTMENT | CUTANEOUS | Status: DC | PRN
  Administered 2023-05-20 (×2): 1 via TOPICAL
  Filled 2023-05-20: qty 10

## 2023-05-20 MED ORDER — SALINE SPRAY 0.65 % NA SOLN
1.0000 | NASAL | Status: DC | PRN
  Administered 2023-05-20 – 2023-05-21 (×4): 1 via NASAL
  Filled 2023-05-20: qty 44

## 2023-05-20 MED ORDER — LIDOCAINE HCL (PF) 1 % IJ SOLN
INTRAMUSCULAR | Status: AC
  Filled 2023-05-20: qty 5

## 2023-05-20 NOTE — Progress Notes (Signed)
PROGRESS NOTE    Anna Cortez  WUJ:[id number] DOB: 1946/10/20 DOA: 05/18/2023 PCP: Edwyna Perfect, MD (Inactive)   Brief Narrative:  Anna Cortez is a 77 y.o. 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   Patient has been evaluated by multiple facilities over the past few days with complaints of nausea vomiting and headache improving with supportive care ultimately discharged back home given improvement in symptoms.  Symptoms continue to worsen uncontrolled by supportive care patient presented back to the hospital 7/18 for repeat evaluation and treatment.  Hospitalist called for admission.  Assessment & Plan:   Principal Problem:   Altered mental status Active Problems:   Anxiety state   Essential hypertension   GERD (gastroesophageal reflux disease)   Hyperlipidemia   Carcinoid tumor of appendix   Severe episode of recurrent major depressive disorder, without psychotic features (HCC)   Stage 3 chronic kidney disease (HCC)  Altered mental status Acute intractable headache Severe sepsis, rule out encephalitis/meningitis Slurred speech - CVA ruled out - Patient presenting with fever, headache, leukocytosis with changes in speech concerning for encephalitis/meningitis as infectious source -Meningitis less likely given no nuchal rigidity with waxing and waning symptoms prior to treatment, would expect someone with viral or bacterial meningitis to deteriorate rapidly without improvement off antibiotics/supportive care only -Imaging thus far has been unrevealing including MRI CT of the head as well as CT of the chest abdomen pelvis. -Cultures remain negative to date -LP attempted again today with no success as patient has intractable pain/nausea, will attempt again  tomorrow with increased analgesics/antiemetics -will likely be diagnostic only as cultures would likely not grow anything given ongoing antibiotics -Continue acyclovir, ceftriaxone, vancomycin.  Ampicillin on hold due to penicillin allergy -Polypharmacy/narcotics likely playing a role in patient's mental status but would not explain her fever leukocytosis or tremors -Blood culture negative thus far, respiratory pathogen panel unremarkable; less than 10K colonies on urine culture   History of carcinoid tumor Rule out acute carcinoid crisis - Status post radiotherapy with lutathera radionucleotide therapy unclear if this was complete/curative treatment or palliative - Now following with [facility] for this per family will follow-up with primary caretaker as soon as office is open on the 22nd -Discussed case with Dr. Pamelia Hoit, this would be an atypical presentation for carcinoid crisis, typically hypotensive with flushing, bronchospasm  -patient presents with headache tremors altered mental status with only transient episode of hypotension and bradycardia. -Pending oncology evaluation may initiate octreotide, anxiolytic and antihistamine per literature review  CKD 3b -Appears to be at baseline - Trend renal function and electrolytes  Hypertension Hyperlipidemia GERD - History of the above but not currently on any medications  DVT prophylaxis: enoxaparin (LOVENOX) injection 40 mg Start: 05/19/23 1745 Code Status:   Code Status: Full Code Family Communication: None present  Status is: Inpatient  Dispo: The patient is from: Home              Anticipated d/c is to: To be determined              Anticipated d/c date is: To be determined              Patient currently not medically stable for discharge  Consultants:  Oncology  Procedures:  LP attempted x 2, unsuccessful  Antimicrobials:  Ceftriaxone, vancomycin, acyclovir  Subjective: No acute issues or  events overnight, patient  continues to have waxing and waning mental status with ongoing upper extremity tremors, voices frustration with difficulty with fine motor movement.  Denies nausea vomiting diarrhea constipation chills chest pain shortness of breath.  Objective: Vitals:   05/19/23 2033 05/20/23 0023 05/20/23 0422 05/20/23 0423  BP: 137/75 (!) 145/59  (!) 150/63  Pulse: 68 68  63  Resp: 18 20  18   Temp: 98.1 F (36.7 C) 98.4 F (36.9 C)  99.7 F (37.6 C)  TempSrc: Oral Oral  Oral  SpO2: 100% 92%  96%  Weight:   89.5 kg   Height:        Intake/Output Summary (Last 24 hours) at 05/20/2023 0800 Last data filed at 05/20/2023 1610 Gross per 24 hour  Intake 1568.88 ml  Output 300 ml  Net 1268.88 ml   Filed Weights   05/19/23 1550 05/20/23 0422  Weight: 87.7 kg 89.5 kg    Examination:  General: Attempting to eat ice chips in bed with profound tremors. HEENT: Pupils equal round reactive to light. Neck:  Without mass or deformity.  Trachea is midline.  Without nuchal rigidity Lungs: Without rales rhonchi or wheeze. Heart: Regular rate and rhythm Abdomen:  Soft, nontender, nondistended Extremities: Tremulous, worse with intention, strength 5 out of 5 globally without focal deficit.   Data Reviewed: I have personally reviewed following labs and imaging studies  CBC: Recent Labs  Lab 05/17/23 1522 05/18/23 2033 05/19/23 1759 05/20/23 0355  WBC 8.6 9.2 8.5 8.6  NEUTROABS  --  7.1  --   --   HGB 14.0 13.0 12.0 11.5*  HCT 40.2 37.7 35.0* 34.3*  MCV 96.4 96.9 99.7 98.6  PLT 164 135* 90* 103*   Basic Metabolic Panel: Recent Labs  Lab 05/17/23 1522 05/18/23 2033 05/19/23 1013 05/19/23 1759 05/20/23 0355  NA 136 138  --  137 136  K 4.2 3.7  --  3.8 3.9  CL 103 103  --  102 99  CO2 20* 25  --  25 24  GLUCOSE 129* 158*  --  108* 107*  BUN 17 21  --  18 16  CREATININE 0.88 1.16* 0.69 1.04* 1.05*  CALCIUM 9.4 9.0  --  8.1* 8.1*   GFR: Estimated Creatinine Clearance: 55.2 mL/min (A)  (by C-G formula based on SCr of 1.05 mg/dL (H)). Liver Function Tests: Recent Labs  Lab 05/17/23 1522 05/18/23 2033 05/19/23 1759 05/20/23 0355  AST 19 20 33 37  ALT 16 16 26 26   ALKPHOS 76 53 46 48  BILITOT 1.0 0.9 0.6 1.0  PROT 7.5 7.0 5.4* 5.5*  ALBUMIN 4.4 4.5 2.8* 3.0*   Recent Labs  Lab 05/17/23 1522  LIPASE 12   Recent Labs  Lab 05/19/23 0149  AMMONIA 15   Coagulation Profile: Recent Labs  Lab 05/18/23 2034  INR 1.1   Cardiac Enzymes: Recent Labs  Lab 05/18/23 2033  CKTOTAL 220   BNP (last 3 results) No results for input(s): "PROBNP" in the last 8760 hours. HbA1C: No results for input(s): "HGBA1C" in the last 72 hours. CBG: No results for input(s): "GLUCAP" in the last 168 hours. Lipid Profile: No results for input(s): "CHOL", "HDL", "LDLCALC", "TRIG", "CHOLHDL", "LDLDIRECT" in the last 72 hours. Thyroid Function Tests: No results for input(s): "TSH", "T4TOTAL", "FREET4", "T3FREE", "THYROIDAB" in the last 72 hours. Anemia Panel: No results for input(s): "VITAMINB12", "FOLATE", "FERRITIN", "TIBC", "IRON", "RETICCTPCT" in the last 72 hours. Sepsis Labs: Recent Labs  Lab 05/18/23 2033  LATICACIDVEN 1.5    Recent Results (from the past 240 hour(s))  Resp panel by RT-PCR (RSV, Flu A&B, Covid) Anterior Nasal Swab     Status: None   Collection Time: 05/17/23  3:22 PM   Specimen: Anterior Nasal Swab  Result Value Ref Range Status   SARS Coronavirus 2 by RT PCR NEGATIVE NEGATIVE Final    Comment: (NOTE) SARS-CoV-2 target nucleic acids are NOT DETECTED.  The SARS-CoV-2 RNA is generally detectable in upper respiratory specimens during the acute phase of infection. The lowest concentration of SARS-CoV-2 viral copies this assay can detect is 138 copies/mL. A negative result does not preclude SARS-Cov-2 infection and should not be used as the sole basis for treatment or other patient management decisions. A negative result may occur with  improper  specimen collection/handling, submission of specimen other than nasopharyngeal swab, presence of viral mutation(s) within the areas targeted by this assay, and inadequate number of viral copies(<138 copies/mL). A negative result must be combined with clinical observations, patient history, and epidemiological information. The expected result is Negative.  Fact Sheet for Patients:  BloggerCourse.com  Fact Sheet for Healthcare Providers:  SeriousBroker.it  This test is no t yet approved or cleared by the [place] FDA and  has been authorized for detection and/or diagnosis of SARS-CoV-2 by FDA under an Emergency Use Authorization (EUA). This EUA will remain  in effect (meaning this test can be used) for the duration of the COVID-19 declaration under Section 564(b)(1) of the Act, 21 U.S.C.section 360bbb-3(b)(1), unless the authorization is terminated  or revoked sooner.       Influenza A by PCR NEGATIVE NEGATIVE Final   Influenza B by PCR NEGATIVE NEGATIVE Final    Comment: (NOTE) The Xpert Xpress SARS-CoV-2/FLU/RSV plus assay is intended as an aid in the diagnosis of influenza from Nasopharyngeal swab specimens and should not be used as a sole basis for treatment. Nasal washings and aspirates are unacceptable for Xpert Xpress SARS-CoV-2/FLU/RSV testing.  Fact Sheet for Patients: BloggerCourse.com  Fact Sheet for Healthcare Providers: SeriousBroker.it  This test is not yet approved or cleared by the [place] FDA and has been authorized for detection and/or diagnosis of SARS-CoV-2 by FDA under an Emergency Use Authorization (EUA). This EUA will remain in effect (meaning this test can be used) for the duration of the COVID-19 declaration under Section 564(b)(1) of the Act, 21 U.S.C. section 360bbb-3(b)(1), unless the authorization is terminated or revoked.     Resp  Syncytial Virus by PCR NEGATIVE NEGATIVE Final    Comment: (NOTE) Fact Sheet for Patients: BloggerCourse.com  Fact Sheet for Healthcare Providers: SeriousBroker.it  This test is not yet approved or cleared by the [place] FDA and has been authorized for detection and/or diagnosis of SARS-CoV-2 by FDA under an Emergency Use Authorization (EUA). This EUA will remain in effect (meaning this test can be used) for the duration of the COVID-19 declaration under Section 564(b)(1) of the Act, 21 U.S.C. section 360bbb-3(b)(1), unless the authorization is terminated or revoked.  Performed at [facility], [address] [place], [place] [address]   Blood culture (routine x 2)     Status: None (Preliminary result)   Collection Time: 05/18/23  8:34 PM   Specimen: BLOOD  Result Value Ref Range Status   Specimen Description   Final    BLOOD RIGHT ANTECUBITAL Performed at [facility], [address], [place], [place] [address]    Special Requests   Final  BOTTLES DRAWN AEROBIC AND ANAEROBIC Blood Culture adequate volume Performed at [facility], [address], [place], [place] [address]    Culture   Final    NO GROWTH 2 DAYS Performed at [facility], [address], [place], [place] [address]    Report Status PENDING  Incomplete  Blood culture (routine x 2)     Status: None (Preliminary result)   Collection Time: 05/18/23  9:18 PM   Specimen: BLOOD  Result Value Ref Range Status   Specimen Description   Final    BLOOD BLOOD RIGHT HAND Performed at [facility], [address] [place], [place] [address]    Special Requests   Final    BOTTLES DRAWN AEROBIC AND ANAEROBIC Blood Culture adequate volume Performed at [facility], [address], [place], [place] [address]    Culture   Final    NO GROWTH < 24  HOURS Performed at [facility], [address] [place], [place] [address]    Report Status PENDING  Incomplete  Resp panel by RT-PCR (RSV, Flu A&B, Covid) Anterior Nasal Swab     Status: None   Collection Time: 05/18/23 10:17 PM   Specimen: Anterior Nasal Swab  Result Value Ref Range Status   SARS Coronavirus 2 by RT PCR NEGATIVE NEGATIVE Final    Comment: (NOTE) SARS-CoV-2 target nucleic acids are NOT DETECTED.  The SARS-CoV-2 RNA is generally detectable in upper respiratory specimens during the acute phase of infection. The lowest concentration of SARS-CoV-2 viral copies this assay can detect is 138 copies/mL. A negative result does not preclude SARS-Cov-2 infection and should not be used as the sole basis for treatment or other patient management decisions. A negative result may occur with  improper specimen collection/handling, submission of specimen other than nasopharyngeal swab, presence of viral mutation(s) within the areas targeted by this assay, and inadequate number of viral copies(<138 copies/mL). A negative result must be combined with clinical observations, patient history, and epidemiological information. The expected result is Negative.  Fact Sheet for Patients:  BloggerCourse.com  Fact Sheet for Healthcare Providers:  SeriousBroker.it  This test is no t yet approved or cleared by the [place] FDA and  has been authorized for detection and/or diagnosis of SARS-CoV-2 by FDA under an Emergency Use Authorization (EUA). This EUA will remain  in effect (meaning this test can be used) for the duration of the COVID-19 declaration under Section 564(b)(1) of the Act, 21 U.S.C.section 360bbb-3(b)(1), unless the authorization is terminated  or revoked sooner.       Influenza A by PCR NEGATIVE NEGATIVE Final   Influenza B by PCR NEGATIVE NEGATIVE Final    Comment: (NOTE) The Xpert Xpress SARS-CoV-2/FLU/RSV  plus assay is intended as an aid in the diagnosis of influenza from Nasopharyngeal swab specimens and should not be used as a sole basis for treatment. Nasal washings and aspirates are unacceptable for Xpert Xpress SARS-CoV-2/FLU/RSV testing.  Fact Sheet for Patients: BloggerCourse.com  Fact Sheet for Healthcare Providers: SeriousBroker.it  This test is not yet approved or cleared by the [place] FDA and has been authorized for detection and/or diagnosis of SARS-CoV-2 by FDA under an Emergency Use Authorization (EUA). This EUA will remain in effect (meaning this test can be used) for the duration of the COVID-19 declaration under Section 564(b)(1) of the Act, 21 U.S.C. section 360bbb-3(b)(1), unless the authorization is terminated or revoked.     Resp Syncytial Virus by PCR NEGATIVE  NEGATIVE Final    Comment: (NOTE) Fact Sheet for Patients: BloggerCourse.com  Fact Sheet for Healthcare Providers: SeriousBroker.it  This test is not yet approved or cleared by the [place] FDA and has been authorized for detection and/or diagnosis of SARS-CoV-2 by FDA under an Emergency Use Authorization (EUA). This EUA will remain in effect (meaning this test can be used) for the duration of the COVID-19 declaration under Section 564(b)(1) of the Act, 21 U.S.C. section 360bbb-3(b)(1), unless the authorization is terminated or revoked.  Performed at [facility], [address] [place], [place] [address]   Respiratory (~20 pathogens) panel by PCR     Status: None   Collection Time: 05/19/23  6:23 PM   Specimen: Nasopharyngeal Swab; Respiratory  Result Value Ref Range Status   Adenovirus NOT DETECTED NOT DETECTED Final   Coronavirus 229E NOT DETECTED NOT DETECTED Final    Comment: (NOTE) The Coronavirus on the Respiratory Panel, DOES NOT test for the novel   Coronavirus (2019 nCoV)    Coronavirus HKU1 NOT DETECTED NOT DETECTED Final   Coronavirus NL63 NOT DETECTED NOT DETECTED Final   Coronavirus OC43 NOT DETECTED NOT DETECTED Final   Metapneumovirus NOT DETECTED NOT DETECTED Final   Rhinovirus / Enterovirus NOT DETECTED NOT DETECTED Final   Influenza A NOT DETECTED NOT DETECTED Final   Influenza B NOT DETECTED NOT DETECTED Final   Parainfluenza Virus 1 NOT DETECTED NOT DETECTED Final   Parainfluenza Virus 2 NOT DETECTED NOT DETECTED Final   Parainfluenza Virus 3 NOT DETECTED NOT DETECTED Final   Parainfluenza Virus 4 NOT DETECTED NOT DETECTED Final   Respiratory Syncytial Virus NOT DETECTED NOT DETECTED Final   Bordetella pertussis NOT DETECTED NOT DETECTED Final   Bordetella Parapertussis NOT DETECTED NOT DETECTED Final   Chlamydophila pneumoniae NOT DETECTED NOT DETECTED Final   Mycoplasma pneumoniae NOT DETECTED NOT DETECTED Final    Comment: Performed at [facility], [address] [place], [place] [address]         Radiology Studies: MR BRAIN WO CONTRAST  Result Date: 05/19/2023 CLINICAL DATA:  Initial evaluation for neuro deficit, stroke suspected, headache, fever. EXAM: MRI HEAD WITHOUT CONTRAST TECHNIQUE: Multiplanar, multiecho pulse sequences of the brain and surrounding structures were obtained without intravenous contrast. COMPARISON:  CT from 05/17/2023. FINDINGS: Brain: Examination technically limited as the patient was unable to tolerate the full length of the study. Additionally, provided images are degraded by motion. Cerebral volume within normal limits. Patchy T2/FLAIR hyperintensity involving the periventricular deep white matter of both cerebral hemispheres, consistent with chronic small vessel ischemic disease, mild for age. No evidence for acute or subacute ischemia. Gray-white matter differentiation maintained. Or sub chronic cortical infarction. No acute or chronic intracranial blood products. No mass  lesion, midline shift or mass effect. No hydrocephalus or extra-axial fluid collection. Pituitary gland and suprasellar region within normal limits. Vascular: Major intracranial vascular flow voids are maintained. Skull and upper cervical spine: Craniocervical junction within normal limits. Bone marrow signal intensity normal. No scalp soft tissue abnormality. Sinuses/Orbits: Globes orbital soft tissues within normal limits. Mild scattered mucosal thickening present throughout the paranasal sinuses. No mastoid effusion. Other: None. IMPRESSION: 1. No acute intracranial abnormality. 2. Mild chronic microvascular ischemic disease for age. Electronically Signed   By: Rise Mu M.D.   On: 05/19/2023 21:56   CT CHEST ABDOMEN PELVIS W CONTRAST  Result Date: 05/18/2023 CLINICAL DATA:  Vomiting. Concern for bowel obstruction. History of neuroendocrine tumor. EXAM: CT CHEST,  ABDOMEN, AND PELVIS WITH CONTRAST TECHNIQUE: Multidetector CT imaging of the chest, abdomen and pelvis was performed following the standard protocol during bolus administration of intravenous contrast. RADIATION DOSE REDUCTION: This exam was performed according to the departmental dose-optimization program which includes automated exposure control, adjustment of the mA and/or kV according to patient size and/or use of iterative reconstruction technique. CONTRAST:  OMNIPAQUE IOHEXOL 300 MG/ML  SOLN COMPARISON:  Chest radiograph dated 05/18/2023. FINDINGS: CT CHEST FINDINGS Cardiovascular: There is no cardiomegaly or pericardial effusion. The thoracic aorta is unremarkable. The central pulmonary arteries appear patent. Mediastinum/Nodes: There is no hilar or mediastinal adenopathy. There is a small hiatal hernia. The esophagus is grossly unremarkable. There is a 2.4 cm nodule inferior to the left thyroid lobe. Further evaluation with ultrasound recommended. No mediastinal fluid collection. Lungs/Pleura: Minimal bibasilar dependent  atelectasis. No focal consolidation, pleural effusion, or pneumothorax. The central airways are patent. Musculoskeletal: No acute osseous pathology. CT ABDOMEN PELVIS FINDINGS No intra-abdominal free air or free fluid. Hepatobiliary: Fatty liver. No biliary ductal dilatation. Multiple stones in the gallbladder. No pericholecystic fluid or evidence of acute cholecystitis by CT. Ultrasound may provide better evaluation of the gallbladder if there is high clinical concern for acute cholecystitis. Pancreas: The pancreas is unremarkable. Spleen: Normal in size without focal abnormality. Adrenals/Urinary Tract: The adrenal glands unremarkable. There is no hydronephrosis on either side. There is symmetric enhancement and excretion of contrast by both kidneys. The visualized ureters and urinary bladder appear unremarkable. Stomach/Bowel: Small hiatal hernia. There is postsurgical changes of the bowel with anastomotic suture in the right upper abdomen. There is no bowel obstruction or active inflammation. There is loose stool in the colon. Somewhat of formed stool noted in the distal colon. Appendectomy. Vascular/Lymphatic: Mild aortoiliac atherosclerotic disease. The IVC is unremarkable. No portal venous gas. There is no adenopathy. Reproductive: The uterus is anteverted.  No adnexal masses. Other: Midline vertical anterior abdominal wall incisional scar. There is diastasis of anterior abdominal wall musculature with a broad based shallow hernia. Musculoskeletal: Degenerative changes of the spine. No acute osseous pathology. IMPRESSION: 1. No acute intrathoracic, abdominal, or pelvic pathology. 2. No bowel obstruction. 3. Fatty liver. 4. Cholelithiasis. 5. A 2.4 cm nodule inferior to the left thyroid lobe. Recommend thyroid US (ref: J Am Coll Radiol. 2015 Feb;12(2): 143-50). 6.  Aortic Atherosclerosis (ICD10-I70.0). Electronically Signed   By: Elgie Collard M.D.   On: 05/18/2023 22:11   CT Head Wo Contrast  Result  Date: 05/18/2023 CLINICAL DATA:  Mental status change, unknown cause h/o neuroendocrine tumor EXAM: CT HEAD WITHOUT CONTRAST TECHNIQUE: Contiguous axial images were obtained from the base of the skull through the vertex without intravenous contrast. RADIATION DOSE REDUCTION: This exam was performed according to the departmental dose-optimization program which includes automated exposure control, adjustment of the mA and/or kV according to patient size and/or use of iterative reconstruction technique. COMPARISON:  None Available. FINDINGS: Brain: No evidence of large-territorial acute infarction. No parenchymal hemorrhage. No mass lesion. No extra-axial collection. No mass effect or midline shift. No hydrocephalus. Basilar cisterns are patent. Vascular: No hyperdense vessel. Skull: No acute fracture or focal lesion. Sinuses/Orbits: Paranasal sinuses and mastoid air cells are clear. The orbits are unremarkable. Other: None. IMPRESSION: No acute intracranial abnormality. Electronically Signed   By: Tish Frederickson M.D.   On: 05/18/2023 22:03   DG Chest Port 1 View  Result Date: 05/18/2023 CLINICAL DATA:  Fever EXAM: PORTABLE CHEST 1 VIEW COMPARISON:  None Available. FINDINGS: Heart  is mildly enlarged. Mediastinal contours within normal limits. No confluent airspace opacities or effusions. No acute bony abnormality. IMPRESSION: Mild cardiomegaly.  No active disease. Electronically Signed   By: Charlett Nose M.D.   On: 05/18/2023 21:32        Scheduled Meds:  enoxaparin (LOVENOX) injection  40 mg Subcutaneous Q24H   sodium chloride flush  3 mL Intravenous Q12H   Continuous Infusions:  acyclovir 875 mg (05/20/23 0714)   cefTRIAXone (ROCEPHIN)  IV 2 g (05/20/23 0439)   lactated ringers 125 mL/hr at 05/20/23 0700   vancomycin 1,000 mg (05/20/23 0532)     LOS: 0 days   Time spent:  Azucena Fallen, DO [facility]  If 7PM-7AM, please contact  night-coverage [email or web address]  05/20/2023, 8:00 AM

## 2023-05-20 NOTE — Progress Notes (Signed)
Interventional Radiology Brief Note:  Patient brought to fluoro today for LP attempt.  Upon arrival she has chills with nausea and reports a headache and bilateral hip pain.  She is agreeable to attempt. Unfortunately, she is exquisitely tender throughout her back despite sufficient local anesthetic. She also intermittently complains she is about to vomit making maintaining her position difficult.  Patient requests to stop.  Procedure unsuccesful. Ordering team notified.    Loyce Dys, MS RD PA-C

## 2023-05-21 DIAGNOSIS — R4182 Altered mental status, unspecified: Secondary | ICD-10-CM | POA: Diagnosis not present

## 2023-05-21 DIAGNOSIS — C7B00 Secondary carcinoid tumors, unspecified site: Secondary | ICD-10-CM | POA: Diagnosis not present

## 2023-05-21 DIAGNOSIS — G9341 Metabolic encephalopathy: Secondary | ICD-10-CM | POA: Diagnosis not present

## 2023-05-21 DIAGNOSIS — F411 Generalized anxiety disorder: Secondary | ICD-10-CM | POA: Diagnosis not present

## 2023-05-21 DIAGNOSIS — D3A020 Benign carcinoid tumor of the appendix: Secondary | ICD-10-CM | POA: Diagnosis not present

## 2023-05-21 LAB — BASIC METABOLIC PANEL
Anion gap: 7 (ref 5–15)
BUN: 10 mg/dL (ref 8–23)
CO2: 27 mmol/L (ref 22–32)
Calcium: 8.1 mg/dL — ABNORMAL LOW (ref 8.9–10.3)
Chloride: 101 mmol/L (ref 98–111)
Creatinine, Ser: 0.91 mg/dL (ref 0.44–1.00)
GFR, Estimated: >60 mL/min (ref >60)
Glucose, Bld: 134 mg/dL — ABNORMAL HIGH (ref 70–99)
Potassium: 2.8 mmol/L — ABNORMAL LOW (ref 3.5–5.1)
Sodium: 135 mmol/L (ref 135–145)

## 2023-05-21 LAB — CULTURE, BLOOD (ROUTINE X 2): Special Requests: ADEQUATE

## 2023-05-21 MED ORDER — SUMATRIPTAN SUCCINATE 50 MG PO TABS
50.0000 mg | ORAL_TABLET | Freq: Once | ORAL | Status: AC
  Administered 2023-05-21: 50 mg via ORAL
  Filled 2023-05-21: qty 1

## 2023-05-21 MED ORDER — IBUPROFEN 200 MG PO TABS
600.0000 mg | ORAL_TABLET | ORAL | Status: DC | PRN
  Administered 2023-05-21 (×2): 600 mg via ORAL
  Filled 2023-05-21 (×2): qty 3

## 2023-05-21 NOTE — Progress Notes (Addendum)
PROGRESS NOTE    Anna Cortez  WUJ:[id number] DOB: [birth year] DOA: 05/18/2023 PCP: Edwyna Perfect, MD (Inactive)   Brief Narrative:  Anna Cortez is a 77 y.o. 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   Patient has been evaluated by multiple facilities over the past few days with complaints of nausea vomiting and headache improving with supportive care ultimately discharged back home given improvement in symptoms.  Symptoms continue to worsen uncontrolled by supportive care patient presented back to the hospital 7/18 for repeat evaluation and treatment.  Hospitalist called for admission.  Assessment & Plan:   Principal Problem:   Altered mental status Active Problems:   Anxiety state   Essential hypertension   GERD (gastroesophageal reflux disease)   Hyperlipidemia   Carcinoid tumor of appendix   Severe episode of recurrent major depressive disorder, without psychotic features (HCC)   Stage 3 chronic kidney disease (HCC)   Acute metabolic encephalopathy  Altered mental status Acute intractable headache Severe sepsis, rule out encephalitis/meningitis Slurred speech - CVA ruled out - Patient presenting with fever, headache, leukocytosis with changes in speech concerning for encephalitis/meningitis as infectious source -Meningitis less likely given no nuchal rigidity with waxing and waning symptoms prior to treatment, would expect someone with viral or bacterial meningitis to deteriorate rapidly without improvement off antibiotics/supportive care only -Imaging thus far has been unrevealing including MRI CT of the head as well as CT of the chest abdomen pelvis. -Cultures remain negative to date -LP attempted 7/21 with no success as patient has intractable pain/nausea,  repeat attempt on the 22nd with increased analgesics/antiemetics was not completed as staff has already left for the day - will likely be diagnostic only as cultures would likely not grow anything given ongoing antibiotics -Continue acyclovir, ceftriaxone, vancomycin.  Ampicillin on hold due to penicillin allergy -Polypharmacy/narcotics likely playing a role in patient's mental status but would not explain her fever leukocytosis or tremors -Blood culture negative thus far, respiratory pathogen panel unremarkable; less than 10K colonies on urine culture   History of carcinoid tumor Rule out acute carcinoid crisis - Status post radiotherapy with lutathera radionucleotide therapy unclear if this was complete/curative treatment or palliative - Per sister the patient stopped seeking care or follow up at some point around 02/2022 in [place] so there is no 'primary' team managing her neuroendocrine tumor - Will order abdominal MRI to evaluate stage of disease -Discussed case with Dr. Pamelia Hoit, this would be an atypical presentation for carcinoid crisis, typically hypotensive with flushing, bronchospasm  - patient presents with headache tremors altered mental status with only transient episode of hypotension and bradycardia. -Pending oncology evaluation may initiate octreotide, anxiolytic and antihistamine per literature review  CKD 3b -Appears to be at baseline - Trend renal function and electrolytes  Hypertension Hyperlipidemia GERD - History of the above but not currently on any medications  DVT prophylaxis: enoxaparin (LOVENOX) injection 40 mg Start: 05/19/23 1745 Code Status:   Code Status: Full Code Family Communication: None present  Status is: Inpatient  Dispo: The patient is from: Home              Anticipated d/c is to: To be determined              Anticipated d/c date is: To be determined  Patient currently not medically stable for discharge  Consultants:   Oncology  Procedures:  LP attempted x 2, unsuccessful  Antimicrobials:  Ceftriaxone, vancomycin, acyclovir  Subjective: No acute issues or events overnight, patient continues to have waxing and waning mental status with ongoing upper extremity tremors, voices frustration with difficulty with fine motor movement.  Denies nausea vomiting diarrhea constipation chills chest pain shortness of breath.  Objective: Vitals:   05/20/23 2310 05/21/23 0303 05/21/23 0447 05/21/23 0721  BP: (!) 146/84 (!) 174/77  (!) 139/57  Pulse: 66 72  (!) 56  Resp: 18 20  18   Temp: 98 F (36.7 C) 98.6 F (37 C)  97.8 F (36.6 C)  TempSrc: Oral Oral  Oral  SpO2: 95% 93%  93%  Weight:   89.4 kg   Height:        Intake/Output Summary (Last 24 hours) at 05/21/2023 0745 Last data filed at 05/21/2023 0537 Gross per 24 hour  Intake 3070.77 ml  Output 1050 ml  Net 2020.77 ml   Filed Weights   05/19/23 1550 05/20/23 0422 05/21/23 0447  Weight: 87.7 kg 89.5 kg 89.4 kg    Examination:  General: Attempting to eat ice chips in bed with profound tremors. HEENT: Pupils equal round reactive to light. Neck:  Without mass or deformity.  Trachea is midline.  Without nuchal rigidity Lungs: Without rales rhonchi or wheeze. Heart: Regular rate and rhythm Abdomen:  Soft, nontender, nondistended Extremities: Tremulous, worse with intention, strength 5 out of 5 globally without focal deficit.   Data Reviewed: I have personally reviewed following labs and imaging studies  CBC: Recent Labs  Lab 05/17/23 1522 05/18/23 2033 05/19/23 1759 05/20/23 0355  WBC 8.6 9.2 8.5 8.6  NEUTROABS  --  7.1  --   --   HGB 14.0 13.0 12.0 11.5*  HCT 40.2 37.7 35.0* 34.3*  MCV 96.4 96.9 99.7 98.6  PLT 164 135* 90* 103*   Basic Metabolic Panel: Recent Labs  Lab 05/17/23 1522 05/18/23 2033 05/19/23 1013 05/19/23 1759 05/20/23 0355  NA 136 138  --  137 136  K 4.2 3.7  --  3.8 3.9  CL 103 103  --  102 99  CO2 20* 25   --  25 24  GLUCOSE 129* 158*  --  108* 107*  BUN 17 21  --  18 16  CREATININE 0.88 1.16* 0.69 1.04* 1.05*  CALCIUM 9.4 9.0  --  8.1* 8.1*   GFR: Estimated Creatinine Clearance: 55.2 mL/min (A) (by C-G formula based on SCr of 1.05 mg/dL (H)). Liver Function Tests: Recent Labs  Lab 05/17/23 1522 05/18/23 2033 05/19/23 1759 05/20/23 0355  AST 19 20 33 37  ALT 16 16 26 26   ALKPHOS 76 53 46 48  BILITOT 1.0 0.9 0.6 1.0  PROT 7.5 7.0 5.4* 5.5*  ALBUMIN 4.4 4.5 2.8* 3.0*   Recent Labs  Lab 05/17/23 1522  LIPASE 12   Recent Labs  Lab 05/19/23 0149  AMMONIA 15   Coagulation Profile: Recent Labs  Lab 05/18/23 2034  INR 1.1   Cardiac Enzymes: Recent Labs  Lab 05/18/23 2033  CKTOTAL 220   BNP (last 3 results) No results for input(s): "PROBNP" in the last 8760 hours. HbA1C: No results for input(s): "HGBA1C" in the last 72 hours. CBG: No results for input(s): "GLUCAP" in the last 168 hours. Lipid Profile: No results for input(s): "CHOL", "HDL", "LDLCALC", "TRIG", "CHOLHDL", "LDLDIRECT" in the last 72 hours. Thyroid Function Tests: No results  for input(s): "TSH", "T4TOTAL", "FREET4", "T3FREE", "THYROIDAB" in the last 72 hours. Anemia Panel: No results for input(s): "VITAMINB12", "FOLATE", "FERRITIN", "TIBC", "IRON", "RETICCTPCT" in the last 72 hours. Sepsis Labs: Recent Labs  Lab 05/18/23 2033  LATICACIDVEN 1.5    Recent Results (from the past 240 hour(s))  Resp panel by RT-PCR (RSV, Flu A&B, Covid) Anterior Nasal Swab     Status: None   Collection Time: 05/17/23  3:22 PM   Specimen: Anterior Nasal Swab  Result Value Ref Range Status   SARS Coronavirus 2 by RT PCR NEGATIVE NEGATIVE Final    Comment: (NOTE) SARS-CoV-2 target nucleic acids are NOT DETECTED.  The SARS-CoV-2 RNA is generally detectable in upper respiratory specimens during the acute phase of infection. The lowest concentration of SARS-CoV-2 viral copies this assay can detect is 138 copies/mL. A  negative result does not preclude SARS-Cov-2 infection and should not be used as the sole basis for treatment or other patient management decisions. A negative result may occur with  improper specimen collection/handling, submission of specimen other than nasopharyngeal swab, presence of viral mutation(s) within the areas targeted by this assay, and inadequate number of viral copies(<138 copies/mL). A negative result must be combined with clinical observations, patient history, and epidemiological information. The expected result is Negative.  Fact Sheet for Patients:  BloggerCourse.com  Fact Sheet for Healthcare Providers:  SeriousBroker.it  This test is no t yet approved or cleared by the [place] FDA and  has been authorized for detection and/or diagnosis of SARS-CoV-2 by FDA under an Emergency Use Authorization (EUA). This EUA will remain  in effect (meaning this test can be used) for the duration of the COVID-19 declaration under Section 564(b)(1) of the Act, 21 U.S.C.section 360bbb-3(b)(1), unless the authorization is terminated  or revoked sooner.       Influenza A by PCR NEGATIVE NEGATIVE Final   Influenza B by PCR NEGATIVE NEGATIVE Final    Comment: (NOTE) The Xpert Xpress SARS-CoV-2/FLU/RSV plus assay is intended as an aid in the diagnosis of influenza from Nasopharyngeal swab specimens and should not be used as a sole basis for treatment. Nasal washings and aspirates are unacceptable for Xpert Xpress SARS-CoV-2/FLU/RSV testing.  Fact Sheet for Patients: BloggerCourse.com  Fact Sheet for Healthcare Providers: SeriousBroker.it  This test is not yet approved or cleared by the [place] FDA and has been authorized for detection and/or diagnosis of SARS-CoV-2 by FDA under an Emergency Use Authorization (EUA). This EUA will remain in effect (meaning this test can  be used) for the duration of the COVID-19 declaration under Section 564(b)(1) of the Act, 21 U.S.C. section 360bbb-3(b)(1), unless the authorization is terminated or revoked.     Resp Syncytial Virus by PCR NEGATIVE NEGATIVE Final    Comment: (NOTE) Fact Sheet for Patients: BloggerCourse.com  Fact Sheet for Healthcare Providers: SeriousBroker.it  This test is not yet approved or cleared by the [place] FDA and has been authorized for detection and/or diagnosis of SARS-CoV-2 by FDA under an Emergency Use Authorization (EUA). This EUA will remain in effect (meaning this test can be used) for the duration of the COVID-19 declaration under Section 564(b)(1) of the Act, 21 U.S.C. section 360bbb-3(b)(1), unless the authorization is terminated or revoked.  Performed at [facility], [address], [place], [place] [address]   Blood culture (routine x 2)     Status: None (Preliminary result)   Collection Time: 05/18/23  8:34 PM   Specimen: BLOOD  Result Value Ref  Range Status   Specimen Description   Final    BLOOD RIGHT ANTECUBITAL Performed at [facility], [address] [place], [place] [address]    Special Requests   Final    BOTTLES DRAWN AEROBIC AND ANAEROBIC Blood Culture adequate volume Performed at [facility], [address], [place], [place] [address]    Culture   Final    NO GROWTH 2 DAYS Performed at [facility], [address] [place], [place] [address]    Report Status PENDING  Incomplete  Blood culture (routine x 2)     Status: None (Preliminary result)   Collection Time: 05/18/23  9:18 PM   Specimen: BLOOD  Result Value Ref Range Status   Specimen Description   Final    BLOOD BLOOD RIGHT HAND Performed at [facility], [address], [place], [place] [address]    Special Requests   Final    BOTTLES DRAWN AEROBIC  AND ANAEROBIC Blood Culture adequate volume Performed at [facility], [address], [place], [place] [address]    Culture   Final    NO GROWTH < 24 HOURS Performed at [facility], [address] [place], [place] [address]    Report Status PENDING  Incomplete  Resp panel by RT-PCR (RSV, Flu A&B, Covid) Anterior Nasal Swab     Status: None   Collection Time: 05/18/23 10:17 PM   Specimen: Anterior Nasal Swab  Result Value Ref Range Status   SARS Coronavirus 2 by RT PCR NEGATIVE NEGATIVE Final    Comment: (NOTE) SARS-CoV-2 target nucleic acids are NOT DETECTED.  The SARS-CoV-2 RNA is generally detectable in upper respiratory specimens during the acute phase of infection. The lowest concentration of SARS-CoV-2 viral copies this assay can detect is 138 copies/mL. A negative result does not preclude SARS-Cov-2 infection and should not be used as the sole basis for treatment or other patient management decisions. A negative result may occur with  improper specimen collection/handling, submission of specimen other than nasopharyngeal swab, presence of viral mutation(s) within the areas targeted by this assay, and inadequate number of viral copies(<138 copies/mL). A negative result must be combined with clinical observations, patient history, and epidemiological information. The expected result is Negative.  Fact Sheet for Patients:  BloggerCourse.com  Fact Sheet for Healthcare Providers:  SeriousBroker.it  This test is no t yet approved or cleared by the [place] FDA and  has been authorized for detection and/or diagnosis of SARS-CoV-2 by FDA under an Emergency Use Authorization (EUA). This EUA will remain  in effect (meaning this test can be used) for the duration of the COVID-19 declaration under Section 564(b)(1) of the Act, 21 U.S.C.section 360bbb-3(b)(1), unless the authorization is terminated   or revoked sooner.       Influenza A by PCR NEGATIVE NEGATIVE Final   Influenza B by PCR NEGATIVE NEGATIVE Final    Comment: (NOTE) The Xpert Xpress SARS-CoV-2/FLU/RSV plus assay is intended as an aid in the diagnosis of influenza from Nasopharyngeal swab specimens and should not be used as a sole basis for treatment. Nasal washings and aspirates are unacceptable for Xpert Xpress SARS-CoV-2/FLU/RSV testing.  Fact Sheet for Patients: BloggerCourse.com  Fact Sheet for Healthcare Providers: SeriousBroker.it  This test is not yet approved or cleared by the [place] FDA and has been authorized for detection and/or diagnosis of SARS-CoV-2 by FDA under an Emergency Use Authorization (EUA). This EUA will remain in effect (meaning this  test can be used) for the duration of the COVID-19 declaration under Section 564(b)(1) of the Act, 21 U.S.C. section 360bbb-3(b)(1), unless the authorization is terminated or revoked.     Resp Syncytial Virus by PCR NEGATIVE NEGATIVE Final    Comment: (NOTE) Fact Sheet for Patients: BloggerCourse.com  Fact Sheet for Healthcare Providers: SeriousBroker.it  This test is not yet approved or cleared by the [place] FDA and has been authorized for detection and/or diagnosis of SARS-CoV-2 by FDA under an Emergency Use Authorization (EUA). This EUA will remain in effect (meaning this test can be used) for the duration of the COVID-19 declaration under Section 564(b)(1) of the Act, 21 U.S.C. section 360bbb-3(b)(1), unless the authorization is terminated or revoked.  Performed at [facility], [address] [place], [place] [address]   Urine Culture     Status: Abnormal   Collection Time: 05/18/23 11:09 PM   Specimen: Urine, Random  Result Value Ref Range Status   Specimen Description   Final    URINE, RANDOM Performed  at [facility], [address], [place], [place] [address]    Special Requests   Final    NONE Reflexed from [id number] Performed at [facility], [address], [place], [place] [address]    Culture (A)  Final    <10,000 COLONIES/mL INSIGNIFICANT GROWTH Performed at [facility], [address], [place], [place] [address]    Report Status 05/20/2023 FINAL  Final  Respiratory (~20 pathogens) panel by PCR     Status: None   Collection Time: 05/19/23  6:23 PM   Specimen: Nasopharyngeal Swab; Respiratory  Result Value Ref Range Status   Adenovirus NOT DETECTED NOT DETECTED Final   Coronavirus 229E NOT DETECTED NOT DETECTED Final    Comment: (NOTE) The Coronavirus on the Respiratory Panel, DOES NOT test for the novel  Coronavirus (2019 nCoV)    Coronavirus HKU1 NOT DETECTED NOT DETECTED Final   Coronavirus NL63 NOT DETECTED NOT DETECTED Final   Coronavirus OC43 NOT DETECTED NOT DETECTED Final   Metapneumovirus NOT DETECTED NOT DETECTED Final   Rhinovirus / Enterovirus NOT DETECTED NOT DETECTED Final   Influenza A NOT DETECTED NOT DETECTED Final   Influenza B NOT DETECTED NOT DETECTED Final   Parainfluenza Virus 1 NOT DETECTED NOT DETECTED Final   Parainfluenza Virus 2 NOT DETECTED NOT DETECTED Final   Parainfluenza Virus 3 NOT DETECTED NOT DETECTED Final   Parainfluenza Virus 4 NOT DETECTED NOT DETECTED Final   Respiratory Syncytial Virus NOT DETECTED NOT DETECTED Final   Bordetella pertussis NOT DETECTED NOT DETECTED Final   Bordetella Parapertussis NOT DETECTED NOT DETECTED Final   Chlamydophila pneumoniae NOT DETECTED NOT DETECTED Final   Mycoplasma pneumoniae NOT DETECTED NOT DETECTED Final    Comment: Performed at [facility], [address], [place], [place] [address]         Radiology Studies: MR BRAIN WO CONTRAST  Result Date: 05/19/2023 CLINICAL DATA:  Initial evaluation for neuro deficit, stroke suspected,  headache, fever. EXAM: MRI HEAD WITHOUT CONTRAST TECHNIQUE: Multiplanar, multiecho pulse sequences of the brain and surrounding structures were obtained without intravenous contrast. COMPARISON:  CT from 05/17/2023. FINDINGS: Brain: Examination technically limited as the patient was unable to tolerate the full length of the study. Additionally, provided images are degraded by motion. Cerebral volume within normal limits. Patchy T2/FLAIR hyperintensity involving the periventricular deep white matter of both cerebral hemispheres, consistent with chronic small vessel ischemic disease, mild  for age. No evidence for acute or subacute ischemia. Gray-white matter differentiation maintained. Or sub chronic cortical infarction. No acute or chronic intracranial blood products. No mass lesion, midline shift or mass effect. No hydrocephalus or extra-axial fluid collection. Pituitary gland and suprasellar region within normal limits. Vascular: Major intracranial vascular flow voids are maintained. Skull and upper cervical spine: Craniocervical junction within normal limits. Bone marrow signal intensity normal. No scalp soft tissue abnormality. Sinuses/Orbits: Globes orbital soft tissues within normal limits. Mild scattered mucosal thickening present throughout the paranasal sinuses. No mastoid effusion. Other: None. IMPRESSION: 1. No acute intracranial abnormality. 2. Mild chronic microvascular ischemic disease for age. Electronically Signed   By: Rise Mu M.D.   On: 05/19/2023 21:56        Scheduled Meds:  enoxaparin (LOVENOX) injection  40 mg Subcutaneous Q24H   sodium chloride flush  3 mL Intravenous Q12H   Continuous Infusions:  acyclovir 875 mg (05/21/23 0706)   cefTRIAXone (ROCEPHIN)  IV 2 g (05/21/23 0446)   lactated ringers 75 mL/hr at 05/21/23 0443   vancomycin 1,000 mg (05/21/23 0537)     LOS: 1 day   Time spent:  Azucena Fallen, DO [facility]  If 7PM-7AM, please  contact night-coverage [email or web address]  05/21/2023, 7:45 AM

## 2023-05-21 NOTE — Consult Note (Signed)
[facility] CONSULT NOTE  Patient Care Team: Hod[place], Acie Fredrickson, MD (Inactive) as PCP - General (Internal Medicine)  CHIEF COMPLAINTS/PURPOSE OF CONSULTATION:  History of metastatic carcinoid tumor  HISTORY OF PRESENTING ILLNESS:  Anna Cortez 77 y.o. female is admitted to the hospital with complaints of nausea vomiting headaches and slightly altered mental status.  When I interviewed her she appeared to be able to answer questions appropriately.  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  4 years ago she moved to [place] and was being seen at [facility].  She tells me that she was diagnosed with metastatic disease to the liver.  However she stopped seeing them about a year ago.  She was apparently offered injections to treat the diarrhea but they have not worked and therefore she stopped them as well. She has moved to [place] area and presented to the hospital with the above-mentioned symptoms.  There was a clinical suspicion for meningitis and a spinal tap was attempted twice and to be canceled. CT chest abdomen pelvis and brain MRI does not show any evidence of metastatic disease.  (Although the best assessment is by dotatate PET)   MEDICAL HISTORY:  Past Medical History:  Diagnosis Date   Degenerative disc disease    Insomnia    Osteoarthritis     SURGICAL HISTORY: Past Surgical History:  Procedure Laterality Date   KNEE SURGERY     Right Knee   TUBAL LIGATION     WISDOM TOOTH EXTRACTION      SOCIAL HISTORY: Social History   Socioeconomic History   Marital status: Single    Spouse name: Not on file   Number of children: Not on file   Years of education: Not on file   Highest education level: Not on file  Occupational History   Not on file  Tobacco Use   Smoking status: Never   Smokeless tobacco: Never  Substance and Sexual Activity   Alcohol use: Yes     Comment: beer   Drug use: No   Sexual activity: Never  Other Topics Concern   Not on file  Social History Narrative   Not on file   Social Determinants of Health   Financial Resource Strain: Not on file  Food Insecurity: Unknown (05/19/2023)   Hunger Vital Sign    Worried About Running Out of Food in the Last Year: Never true    Ran Out of Food in the Last Year: Not on file  Transportation Needs: Not on file  Physical Activity: Not on file  Stress: Not on file  Social Connections: Not on file  Intimate Partner Violence: Not on file    FAMILY HISTORY: Family History  Problem Relation Age of Onset   Heart disease Neg Hx    Prostate cancer Father    Colon cancer Neg Hx    Breast cancer Neg Hx    Hypertension Neg Hx    Diabetes Neg Hx    Hyperlipidemia Neg Hx     ALLERGIES:  is allergic to astemizole, penicillins, prochlorperazine edisylate, and tolectin [tolmetin sodium].  MEDICATIONS:  Current Facility-Administered Medications  Medication Dose Route Frequency Provider Last Rate Last Admin   acetaminophen (TYLENOL) tablet 650 mg  650 mg Oral Q6H PRN Synetta Fail, MD   650 mg at 05/21/23 0444   Or   acetaminophen (TYLENOL) suppository 650 mg  650 mg Rectal Q6H PRN  Synetta Fail, MD       acyclovir (ZOVIRAX) 875 mg in dextrose 5 % 250 mL IVPB  10 mg/kg Intravenous Q8H Kendal Hymen, RPH 267.5 mL/hr at 05/21/23 1454 875 mg at 05/21/23 1454   aspirin-acetaminophen-caffeine (EXCEDRIN MIGRAINE) per tablet 1 tablet  1 tablet Oral Q6H PRN Synetta Fail, MD   1 tablet at 05/21/23 1452   cefTRIAXone (ROCEPHIN) 2 g in sodium chloride 0.9 % 100 mL IVPB  2 g Intravenous Q12H Synetta Fail, MD 200 mL/hr at 05/21/23 0446 2 g at 05/21/23 0446   enoxaparin (LOVENOX) injection 40 mg  40 mg Subcutaneous Q24H Synetta Fail, MD   40 mg at 05/20/23 1652   ibuprofen (ADVIL) tablet 600 mg  600 mg Oral Q4H PRN Azucena Fallen, MD       lactated ringers  infusion   Intravenous Continuous Kendal Hymen, RPH 75 mL/hr at 05/21/23 0443 New Bag at 05/21/23 0443   lip balm (CARMEX) ointment 1 Application  1 Application Topical PRN Azucena Fallen, MD   1 Application at 05/20/23 1551   polyethylene glycol (MIRALAX / GLYCOLAX) packet 17 g  17 g Oral Daily PRN Synetta Fail, MD       sodium chloride (OCEAN) 0.65 % nasal spray 1 spray  1 spray Each Nare PRN Azucena Fallen, MD   1 spray at 05/21/23 1003   sodium chloride flush (NS) 0.9 % injection 3 mL  3 mL Intravenous Q12H Synetta Fail, MD   3 mL at 05/21/23 1003   vancomycin (VANCOCIN) IVPB 1000 mg/200 mL premix  1,000 mg Intravenous Q12H Synetta Fail, MD 200 mL/hr at 05/21/23 0537 1,000 mg at 05/21/23 0537    REVIEW OF SYSTEMS:   Constitutional: Denies fevers, chills or abnormal night sweats All other systems were reviewed with the patient and are negative.  PHYSICAL EXAMINATION: ECOG PERFORMANCE STATUS: 3 - Symptomatic, >50% confined to bed  Vitals:   05/21/23 1136 05/21/23 1635  BP: (!) 155/56 136/65  Pulse: 61 71  Resp: 19 13  Temp: 99.3 F (37.4 C) 97.8 F (36.6 C)  SpO2: 100% 90%   Filed Weights   05/19/23 1550 05/20/23 0422 05/21/23 0447  Weight: 193 lb 5.5 oz (87.7 kg) 197 lb 5 oz (89.5 kg) 197 lb 1.5 oz (89.4 kg)    GENERAL:alert, no distress and comfortable  LABORATORY DATA:  I have reviewed the data as listed Lab Results  Component Value Date   WBC 8.6 05/20/2023   HGB 11.5 (L) 05/20/2023   HCT 34.3 (L) 05/20/2023   MCV 98.6 05/20/2023   PLT 103 (L) 05/20/2023   Lab Results  Component Value Date   NA 135 05/21/2023   K 2.8 (L) 05/21/2023   CL 101 05/21/2023   CO2 27 05/21/2023    RADIOGRAPHIC STUDIES: I have personally reviewed the radiological reports and agreed with the findings in the report.  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  Patient reports that she was diagnosed with  metastatic carcinoid tumor involving the liver.  I asked her to obtain the name and phone number on her online access to any of her medical records so that we can verify that information.  Although it appears that she has not followed them for over a year. CT scan and brain MRI did not show any evidence of metastatic carcinoid tumor (although the best scan is dotatate PET scan) Headaches nausea  vomiting and meningismus: Spinal tap is pending.  We will follow along  All questions were answered. The patient knows to call the clinic with any problems, questions or concerns.    Tamsen Meek, MD @T @

## 2023-05-22 ENCOUNTER — Inpatient Hospital Stay (HOSPITAL_COMMUNITY): Payer: Medicare Other

## 2023-05-22 DIAGNOSIS — D3A020 Benign carcinoid tumor of the appendix: Secondary | ICD-10-CM | POA: Diagnosis not present

## 2023-05-22 DIAGNOSIS — E340 Carcinoid syndrome: Secondary | ICD-10-CM

## 2023-05-22 DIAGNOSIS — R4182 Altered mental status, unspecified: Secondary | ICD-10-CM | POA: Diagnosis not present

## 2023-05-22 DIAGNOSIS — F411 Generalized anxiety disorder: Secondary | ICD-10-CM | POA: Diagnosis not present

## 2023-05-22 DIAGNOSIS — G9341 Metabolic encephalopathy: Secondary | ICD-10-CM | POA: Diagnosis not present

## 2023-05-22 DIAGNOSIS — R291 Meningismus: Secondary | ICD-10-CM

## 2023-05-22 LAB — MENINGITIS/ENCEPHALITIS PANEL (CSF)

## 2023-05-22 LAB — BASIC METABOLIC PANEL
Anion gap: 10 (ref 5–15)
BUN: 8 mg/dL (ref 8–23)
CO2: 30 mmol/L (ref 22–32)
Calcium: 8.4 mg/dL — ABNORMAL LOW (ref 8.9–10.3)
Chloride: 102 mmol/L (ref 98–111)
Creatinine, Ser: 0.88 mg/dL (ref 0.44–1.00)
GFR, Estimated: >60 mL/min (ref >60)
Glucose, Bld: 107 mg/dL — ABNORMAL HIGH (ref 70–99)
Potassium: 3.4 mmol/L — ABNORMAL LOW (ref 3.5–5.1)
Sodium: 142 mmol/L (ref 135–145)

## 2023-05-22 LAB — CSF CELL COUNT WITH DIFFERENTIAL
Eosinophils, CSF: 0 % (ref 0–1)
Eosinophils, CSF: 1 % (ref 0–1)
Lymphs, CSF: 81 % — ABNORMAL HIGH (ref 40–80)
Lymphs, CSF: 83 % — ABNORMAL HIGH (ref 40–80)
Monocyte-Macrophage-Spinal Fluid: 16 % (ref 15–45)
Monocyte-Macrophage-Spinal Fluid: 19 % (ref 15–45)
RBC Count, CSF: 145 /mm3 — ABNORMAL HIGH
RBC Count, CSF: 23 /mm3 — ABNORMAL HIGH
Segmented Neutrophils-CSF: 0 % (ref 0–6)
Segmented Neutrophils-CSF: 0 % (ref 0–6)
Tube #: 1
Tube #: 4
WBC, CSF: 130 /mm3 (ref 0–5)
WBC, CSF: 139 /mm3 (ref 0–5)

## 2023-05-22 LAB — PROTEIN AND GLUCOSE, CSF
Glucose, CSF: 45 mg/dL (ref 40–70)
Total  Protein, CSF: 74 mg/dL — ABNORMAL HIGH (ref 15–45)

## 2023-05-22 LAB — CULTURE, BLOOD (ROUTINE X 2)

## 2023-05-22 LAB — VANCOMYCIN, TROUGH: Vancomycin Tr: 8 ug/mL — ABNORMAL LOW (ref 15–20)

## 2023-05-22 LAB — CSF CULTURE W GRAM STAIN

## 2023-05-22 MED ORDER — LORAZEPAM 1 MG PO TABS: 1.0000 mg | ORAL_TABLET | ORAL | Status: DC | PRN

## 2023-05-22 MED ORDER — VANCOMYCIN HCL 1500 MG/300ML IV SOLN
1500.0000 mg | Freq: Two times a day (BID) | INTRAVENOUS | Status: DC
  Filled 2023-05-22: qty 300

## 2023-05-22 NOTE — Progress Notes (Addendum)
PROGRESS NOTE    KAMARA ALLAN  ZHY:[id number] DOB: 1946-06-02 DOA: 05/18/2023 PCP: Edwyna Perfect, MD (Inactive)   Brief Narrative:  AMERIE BEAUMONT is a 77 y.o. 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   Patient has been evaluated by multiple facilities over the past few days with complaints of nausea vomiting and headache improving with supportive care ultimately discharged back home given improvement in symptoms.  Symptoms continue to worsen uncontrolled by supportive care patient presented back to the hospital 7/18 for repeat evaluation and treatment.  Hospitalist called for admission.  Assessment & Plan:   Principal Problem:   Altered mental status Active Problems:   Anxiety state   Essential hypertension   GERD (gastroesophageal reflux disease)   Hyperlipidemia   Carcinoid tumor of appendix   Severe episode of recurrent major depressive disorder, without psychotic features (HCC)   Stage 3 chronic kidney disease (HCC)   Acute metabolic encephalopathy   Metastatic carcinoid tumor (HCC)  Altered mental status Acute intractable headache SIRS without source - unlikely encephalitis/meningitis Slurred speech - CVA ruled out - Patient presenting with fever, headache, leukocytosis with no clear source - imaging negative and LP cannot be done; patient continues to have waxing/waning status which is inconsistent with infection - Discussed with ID - hold antibiotics/antivirals for 24-48h and follow for symptoms/fever/etc - Meningitis less likely given no nuchal rigidity with waxing and waning symptoms, notably mental status transiently altered around narcotics per sister - Imaging thus far has been unrevealing including MRI CT of the head as well as CT of the chest  abdomen pelvis. - MRI abdomen remarkable for 6+ lesions concerning for metastatic disease -LP attempted 7/21 with no success as patient has intractable pain/nausea, repeat attempt on the 22nd not performed. IR now indicate no clear window for LP based on recent imaging. -Polypharmacy/narcotics likely playing a major role in patient's mental status changes; but would not explain her fever leukocytosis or tremors -Blood culture negative thus far, respiratory pathogen panel unremarkable; less than 10K colonies on urine culture. LP again unable to be performed as above Dr Pamelia Hoit able to get LP at bedside this afternoon - CSF labs/cultures/cytology sent off; most common neuroendocrine hormone levels also sent off to be checked (Gastrin, glucagon, insulin, serotonin, vasoactive intestinal polypeptide)  History of carcinoid tumor Rule out acute carcinoid syndrome vs crisis - Status post radiotherapy with lutathera radionucleotide therapy likely incomplete treatment given patient quit following up May 2023 now with MRI showing multiple liver lesions consistent with mets. -Discussed case with Dr. Pamelia Hoit, this would be an atypical presentation for carcinoid crisis, typically hypotensive with flushing, bronchospasm  - patient presents with headache tremors altered mental status with only transient episode of hypotension and bradycardia. -Pending oncology evaluation may initiate octreotide, anxiolytic and/or antihistamine per literature review  CKD 3b -Appears to be better than baseline -Labs currently WNL  Hypertension Hyperlipidemia GERD - History of the above but not currently on any medications  DVT prophylaxis: enoxaparin (LOVENOX) injection 40 mg Start: 05/19/23 1745 Code Status:   Code Status: Full Code Family Communication: Bonita Quin updated over the phone  Status is: Inpatient  Dispo: The patient is from: Home              Anticipated d/c is to: To be determined  Anticipated d/c  date is: To be determined              Patient currently not medically stable for discharge  Consultants:  Oncology  Procedures:  LP attempted, unsuccessful  Antimicrobials:  Ceftriaxone, vancomycin, acyclovir Stopped 05/22/23  Subjective: No acute issues or events overnight, admits to ongoing headache but denies vision changes, fevers, chills, nausea, or vomiting.  Objective: Vitals:   05/21/23 1933 05/21/23 2326 05/22/23 0351 05/22/23 0417  BP: (!) 159/86 (!) 144/83 (!) 163/66   Pulse: (!) 55 61 (!) 43   Resp: 16 16 16    Temp: 97.9 F (36.6 C) 98.6 F (37 C) 97.7 F (36.5 C)   TempSrc: Oral Oral Oral   SpO2: 90% 95% 95%   Weight:    88.3 kg  Height:        Intake/Output Summary (Last 24 hours) at 05/22/2023 0723 Last data filed at 05/22/2023 5573 Gross per 24 hour  Intake 3538.94 ml  Output --  Net 3538.94 ml   Filed Weights   05/20/23 0422 05/21/23 0447 05/22/23 0417  Weight: 89.5 kg 89.4 kg 88.3 kg    Examination:  General: Somnolent but easily arousable, without tremors/intentional or otherwise; speaking in full clear sentences. HEENT: Pupils equal round reactive to light. Neck:  Without mass or deformity.  Trachea is midline.  Without nuchal rigidity Lungs: Without rales rhonchi or wheeze. Heart: Regular rate and rhythm Abdomen:  Soft, nontender, nondistended Extremities: Strength 5 out of 5 globally without focal deficit.  Data Reviewed: I have personally reviewed following labs and imaging studies  CBC: Recent Labs  Lab 05/17/23 1522 05/18/23 2033 05/19/23 1759 05/20/23 0355  WBC 8.6 9.2 8.5 8.6  NEUTROABS  --  7.1  --   --   HGB 14.0 13.0 12.0 11.5*  HCT 40.2 37.7 35.0* 34.3*  MCV 96.4 96.9 99.7 98.6  PLT 164 135* 90* 103*   Basic Metabolic Panel: Recent Labs  Lab 05/17/23 1522 05/18/23 2033 05/19/23 1013 05/19/23 1759 05/20/23 0355 05/21/23 0833  NA 136 138  --  137 136 135  K 4.2 3.7  --  3.8 3.9 2.8*  CL 103 103  --  102 99 101   CO2 20* 25  --  25 24 27   GLUCOSE 129* 158*  --  108* 107* 134*  BUN 17 21  --  18 16 10   CREATININE 0.88 1.16* 0.69 1.04* 1.05* 0.91  CALCIUM 9.4 9.0  --  8.1* 8.1* 8.1*   GFR: Estimated Creatinine Clearance: 63.2 mL/min (by C-G formula based on SCr of 0.91 mg/dL). Liver Function Tests: Recent Labs  Lab 05/17/23 1522 05/18/23 2033 05/19/23 1759 05/20/23 0355  AST 19 20 33 37  ALT 16 16 26 26   ALKPHOS 76 53 46 48  BILITOT 1.0 0.9 0.6 1.0  PROT 7.5 7.0 5.4* 5.5*  ALBUMIN 4.4 4.5 2.8* 3.0*   Recent Labs  Lab 05/17/23 1522  LIPASE 12   Recent Labs  Lab 05/19/23 0149  AMMONIA 15   Coagulation Profile: Recent Labs  Lab 05/18/23 2034  INR 1.1   Cardiac Enzymes: Recent Labs  Lab 05/18/23 2033  CKTOTAL 220   Sepsis Labs: Recent Labs  Lab 05/18/23 2033  LATICACIDVEN 1.5    Recent Results (from the past 240 hour(s))  Resp panel by RT-PCR (RSV, Flu A&B, Covid) Anterior Nasal Swab     Status: None   Collection Time: 05/17/23  3:22 PM   Specimen: Anterior Nasal Swab  Result Value Ref Range Status   SARS Coronavirus 2 by RT PCR NEGATIVE NEGATIVE Final    Comment: (NOTE) SARS-CoV-2 target nucleic acids are NOT DETECTED.  The SARS-CoV-2 RNA is generally detectable in upper respiratory specimens during the acute phase of infection. The lowest concentration of SARS-CoV-2 viral copies this assay can detect is 138 copies/mL. A negative result does not preclude SARS-Cov-2 infection and should not be used as the sole basis for treatment or other patient management decisions. A negative result may occur with  improper specimen collection/handling, submission of specimen other than nasopharyngeal swab, presence of viral mutation(s) within the areas targeted by this assay, and inadequate number of viral copies(<138 copies/mL). A negative result must be combined with clinical observations, patient history, and epidemiological information. The expected result is  Negative.  Fact Sheet for Patients:  BloggerCourse.com  Fact Sheet for Healthcare Providers:  SeriousBroker.it  This test is no t yet approved or cleared by the [place] FDA and  has been authorized for detection and/or diagnosis of SARS-CoV-2 by FDA under an Emergency Use Authorization (EUA). This EUA will remain  in effect (meaning this test can be used) for the duration of the COVID-19 declaration under Section 564(b)(1) of the Act, 21 U.S.C.section 360bbb-3(b)(1), unless the authorization is terminated  or revoked sooner.       Influenza A by PCR NEGATIVE NEGATIVE Final   Influenza B by PCR NEGATIVE NEGATIVE Final    Comment: (NOTE) The Xpert Xpress SARS-CoV-2/FLU/RSV plus assay is intended as an aid in the diagnosis of influenza from Nasopharyngeal swab specimens and should not be used as a sole basis for treatment. Nasal washings and aspirates are unacceptable for Xpert Xpress SARS-CoV-2/FLU/RSV testing.  Fact Sheet for Patients: BloggerCourse.com  Fact Sheet for Healthcare Providers: SeriousBroker.it  This test is not yet approved or cleared by the [place] FDA and has been authorized for detection and/or diagnosis of SARS-CoV-2 by FDA under an Emergency Use Authorization (EUA). This EUA will remain in effect (meaning this test can be used) for the duration of the COVID-19 declaration under Section 564(b)(1) of the Act, 21 U.S.C. section 360bbb-3(b)(1), unless the authorization is terminated or revoked.     Resp Syncytial Virus by PCR NEGATIVE NEGATIVE Final    Comment: (NOTE) Fact Sheet for Patients: BloggerCourse.com  Fact Sheet for Healthcare Providers: SeriousBroker.it  This test is not yet approved or cleared by the [place] FDA and has been authorized for detection and/or diagnosis of  SARS-CoV-2 by FDA under an Emergency Use Authorization (EUA). This EUA will remain in effect (meaning this test can be used) for the duration of the COVID-19 declaration under Section 564(b)(1) of the Act, 21 U.S.C. section 360bbb-3(b)(1), unless the authorization is terminated or revoked.  Performed at [facility], [address], [place], [place] [address]   Blood culture (routine x 2)     Status: None (Preliminary result)   Collection Time: 05/18/23  8:34 PM   Specimen: BLOOD  Result Value Ref Range Status   Specimen Description   Final    BLOOD RIGHT ANTECUBITAL Performed at [facility], [address] [place], [place] [address]    Special Requests   Final    BOTTLES DRAWN AEROBIC AND ANAEROBIC Blood Culture adequate volume Performed at [facility], [address], [place], [place] [address]    Culture   Final    NO GROWTH 3 DAYS Performed at [facility], [address]  [place], [place] [address]    Report Status PENDING  Incomplete  Blood culture (routine x 2)     Status: None (Preliminary result)   Collection Time: 05/18/23  9:18 PM   Specimen: BLOOD  Result Value Ref Range Status   Specimen Description   Final    BLOOD BLOOD RIGHT HAND Performed at [facility], [address], [place], [place] [address]    Special Requests   Final    BOTTLES DRAWN AEROBIC AND ANAEROBIC Blood Culture adequate volume Performed at [facility], [address] [place], [place] [address]    Culture   Final    NO GROWTH 2 DAYS Performed at [facility], [address] [place], [place] [address]    Report Status PENDING  Incomplete  Resp panel by RT-PCR (RSV, Flu A&B, Covid) Anterior Nasal Swab     Status: None   Collection Time: 05/18/23 10:17 PM   Specimen: Anterior Nasal Swab  Result Value Ref Range Status   SARS Coronavirus 2 by RT PCR NEGATIVE NEGATIVE Final     Comment: (NOTE) SARS-CoV-2 target nucleic acids are NOT DETECTED.  The SARS-CoV-2 RNA is generally detectable in upper respiratory specimens during the acute phase of infection. The lowest concentration of SARS-CoV-2 viral copies this assay can detect is 138 copies/mL. A negative result does not preclude SARS-Cov-2 infection and should not be used as the sole basis for treatment or other patient management decisions. A negative result may occur with  improper specimen collection/handling, submission of specimen other than nasopharyngeal swab, presence of viral mutation(s) within the areas targeted by this assay, and inadequate number of viral copies(<138 copies/mL). A negative result must be combined with clinical observations, patient history, and epidemiological information. The expected result is Negative.  Fact Sheet for Patients:  BloggerCourse.com  Fact Sheet for Healthcare Providers:  SeriousBroker.it  This test is no t yet approved or cleared by the [place] FDA and  has been authorized for detection and/or diagnosis of SARS-CoV-2 by FDA under an Emergency Use Authorization (EUA). This EUA will remain  in effect (meaning this test can be used) for the duration of the COVID-19 declaration under Section 564(b)(1) of the Act, 21 U.S.C.section 360bbb-3(b)(1), unless the authorization is terminated  or revoked sooner.       Influenza A by PCR NEGATIVE NEGATIVE Final   Influenza B by PCR NEGATIVE NEGATIVE Final    Comment: (NOTE) The Xpert Xpress SARS-CoV-2/FLU/RSV plus assay is intended as an aid in the diagnosis of influenza from Nasopharyngeal swab specimens and should not be used as a sole basis for treatment. Nasal washings and aspirates are unacceptable for Xpert Xpress SARS-CoV-2/FLU/RSV testing.  Fact Sheet for Patients: BloggerCourse.com  Fact Sheet for Healthcare  Providers: SeriousBroker.it  This test is not yet approved or cleared by the [place] FDA and has been authorized for detection and/or diagnosis of SARS-CoV-2 by FDA under an Emergency Use Authorization (EUA). This EUA will remain in effect (meaning this test can be used) for the duration of the COVID-19 declaration under Section 564(b)(1) of the Act, 21 U.S.C. section 360bbb-3(b)(1), unless the authorization is terminated or revoked.     Resp Syncytial Virus by PCR NEGATIVE NEGATIVE Final    Comment: (NOTE) Fact Sheet for Patients: BloggerCourse.com  Fact Sheet for Healthcare Providers: SeriousBroker.it  This test is not yet approved or cleared by the [place] FDA and has been authorized for detection and/or diagnosis of SARS-CoV-2 by FDA under  an Emergency Use Authorization (EUA). This EUA will remain in effect (meaning this test can be used) for the duration of the COVID-19 declaration under Section 564(b)(1) of the Act, 21 U.S.C. section 360bbb-3(b)(1), unless the authorization is terminated or revoked.  Performed at [facility], [address] [place], [place] [address]   Urine Culture     Status: Abnormal   Collection Time: 05/18/23 11:09 PM   Specimen: Urine, Random  Result Value Ref Range Status   Specimen Description   Final    URINE, RANDOM Performed at [facility], [address], [place], [place] [address]    Special Requests   Final    NONE Reflexed from [phone] [phone] Performed at [facility], [address] [place], [place] [address]    Culture (A)  Final    <10,000 COLONIES/mL INSIGNIFICANT GROWTH Performed at [facility], [address], [place], [place] [address]    Report Status 05/20/2023 FINAL  Final  Respiratory (~20 pathogens) panel by PCR     Status: None   Collection Time: 05/19/23  6:23 PM    Specimen: Nasopharyngeal Swab; Respiratory  Result Value Ref Range Status   Adenovirus NOT DETECTED NOT DETECTED Final   Coronavirus 229E NOT DETECTED NOT DETECTED Final    Comment: (NOTE) The Coronavirus on the Respiratory Panel, DOES NOT test for the novel  Coronavirus (2019 nCoV)    Coronavirus HKU1 NOT DETECTED NOT DETECTED Final   Coronavirus NL63 NOT DETECTED NOT DETECTED Final   Coronavirus OC43 NOT DETECTED NOT DETECTED Final   Metapneumovirus NOT DETECTED NOT DETECTED Final   Rhinovirus / Enterovirus NOT DETECTED NOT DETECTED Final   Influenza A NOT DETECTED NOT DETECTED Final   Influenza B NOT DETECTED NOT DETECTED Final   Parainfluenza Virus 1 NOT DETECTED NOT DETECTED Final   Parainfluenza Virus 2 NOT DETECTED NOT DETECTED Final   Parainfluenza Virus 3 NOT DETECTED NOT DETECTED Final   Parainfluenza Virus 4 NOT DETECTED NOT DETECTED Final   Respiratory Syncytial Virus NOT DETECTED NOT DETECTED Final   Bordetella pertussis NOT DETECTED NOT DETECTED Final   Bordetella Parapertussis NOT DETECTED NOT DETECTED Final   Chlamydophila pneumoniae NOT DETECTED NOT DETECTED Final   Mycoplasma pneumoniae NOT DETECTED NOT DETECTED Final    Comment: Performed at [facility], [address], [place], [place] [address]         Radiology Studies: DG [place] GUIDED LUMBAR PUNCTURE  Result Date: 05/21/2023 CLINICAL DATA:  76 year old with encephalopathy, headache, fevers. Lumbar puncture requested. EXAM: LUMBAR PUNCTURE UNDER FLUOROSCOPY PROCEDURE: An appropriate skin entry site was determined fluoroscopically. Operator donned sterile gloves and mask. Skin site was marked, then prepped with Betadine, draped in usual sterile fashion, and infiltrated locally with 1% lidocaine. Procedure was attempted with a 20 gauge spinal needle, however unsuccessful in accessing thecal sac due to patient's inability to tolerate the procedure due to pain and nausea. The procedure was terminated and needle  was then removed. FLUOROSCOPY: Radiation Exposure Index (as provided by the fluoroscopic device): 12.4 mGy Kerma IMPRESSION: Unsuccessful lumbar puncture attempt under fluoroscopy. This exam was performed by Loyce Dys PA-C, and was supervised and interpreted by Carey Bullocks, MD. Electronically Signed   By: Carey Bullocks M.D.   On: 05/21/2023 11:35        Scheduled Meds:  enoxaparin (LOVENOX) injection  40 mg Subcutaneous Q24H   sodium chloride flush  3 mL Intravenous Q12H   Continuous Infusions:  acyclovir 875 mg (05/22/23 0625)   cefTRIAXone (ROCEPHIN)  IV 2 g (05/22/23 0527)   lactated ringers Stopped (05/22/23 0525)   vancomycin Stopped (05/21/23 1955)     LOS: 2 days   Time spent:  Azucena Fallen, DO [facility]  If 7PM-7AM, please contact night-coverage [email or web address]  05/22/2023, 7:23 AM

## 2023-05-22 NOTE — Progress Notes (Signed)
Informed radiologist views imagery and stated LP may not be possible based off those views. MD notified .

## 2023-05-22 NOTE — Progress Notes (Signed)
S: Headaches are slightly better, diarrhea continues as a baseline chronic negative.  Fevers are down.    05/22/2023    3:16 PM 05/22/2023   11:48 AM 05/22/2023    8:48 AM  Vitals with BMI  Systolic 138 163 132  Diastolic 61 65 68  Pulse 55 45 43      Latest Ref Rng & Units 05/20/2023    3:55 AM 05/19/2023    5:59 PM 05/18/2023    8:33 PM  CBC  WBC 4.0 - 10.5 K/uL 8.6  8.5  9.2   Hemoglobin 12.0 - 15.0 g/dL 44.0  10.2  72.5   Hematocrit 36.0 - 46.0 % 34.3  35.0  37.7   Platelets 150 - 400 K/uL 103  90  135       Latest Ref Rng & Units 05/22/2023    8:20 AM 05/21/2023    8:33 AM 05/20/2023    3:55 AM  CMP  Glucose 70 - 99 mg/dL 366  440  347   BUN 8 - 23 mg/dL 8  10  16    Creatinine 0.44 - 1.00 mg/dL 4.25  9.56  3.87   Sodium 135 - 145 mmol/L 142  135  136   Potassium 3.5 - 5.1 mmol/L 3.4  2.8  3.9   Chloride 98 - 111 mmol/L 102  101  99   CO2 22 - 32 mmol/L 30  27  24    Calcium 8.9 - 10.3 mg/dL 8.4  8.1  8.1   Total Protein 6.5 - 8.1 g/dL   5.5   Total Bilirubin 0.3 - 1.2 mg/dL   1.0   Alkaline Phos 38 - 126 U/L   48   AST 15 - 41 U/L   37   ALT 0 - 44 U/L   26    Assessment plan: Neurological symptoms; LP could not be performed by radiology.  We will attempt to perform the spinal tap. 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  I will call and obtain records from her treatment in [place]. Cell count cytology infectious disease workup in the spinal fluid will be done. Will follow along.

## 2023-05-22 NOTE — Procedures (Signed)
Lumbar puncture procedure note  Date of Procedure: 05/22/2023 Indication: Meningismus workup Type of Anesthesia: Local 1% lidocaine  Consent has been obtained from the patient explaining the risks and benefits of the procedure including pain, bleeding and infection. Patient was placed in a sitting position leaning forward arching the back. Patient's name and date of birth were verified. The skin was sterilized with Betadine. 1% lidocaine was used to anesthetize the skin and subcutaneous tissues. The spinal needle was used to enter the L4-L5 intervertebral space and advanced to get clear CSF. Fluid was collected in specimen containers and labeled in the room to be sent for testing. 8 cc of CSF was obtained.The CSF was sent for cell count, cytology, glucose and protein and cultures. Patient will return next week to go over the results of the tests or sooner if the cell count suggests infection.  The needle was removed and pressure was placed the site of needle entry. Small Band-Aid was then placed over the site and patient was made to lay on the back or 15 minutes.    Complications: None Blood loss : None Disposition: Tolerated the procedure well and discharged home. Spinal fluid will be sent for cell count, cytology, infectious disease workup.  Signed Tamsen Meek, MD

## 2023-05-22 NOTE — Progress Notes (Signed)
Pharmacy Antibiotic Note  Anna Cortez is a 77 y.o. female admitted on 05/18/2023 with rule out meningitis/encephalitis. Pharmacy has been consulted for vancomycin dosing.   WBC 8.6, Scr 0.88 (CrCl 65 mL/min). Concern for possible encephalitis/meningitis given fevers and severe headache with waxing/waning mental status per notes. Currently on vancomycin, ceftriaxone, and acyclovir.   7/23 vancomycin trough was drawn 3 hours late and returned subtherapeutic at 8 (goal 15-20 for potential meningitis).   Plan: Increase vancomycin to 1500 mg every 12 hours Continue acyclovir 875 mg (10 mg/kg) every 8 hours  Continue LR at 125 mL/hr  Monitor renal fx, cx results, clinical pic   Height: 5\' 9"  (175.3 cm) Weight: 88.3 kg (194 lb 10.7 oz) IBW/kg (Calculated) : 66.2  Temp (24hrs), Avg:98.1 F (36.7 C), Min:97.7 F (36.5 C), Max:98.6 F (37 C)  Recent Labs  Lab 05/17/23 1522 05/18/23 2033 05/19/23 1013 05/19/23 1759 05/20/23 0355 05/21/23 0833 05/22/23 0820  WBC 8.6 9.2  --  8.5 8.6  --   --   CREATININE 0.88 1.16* 0.69 1.04* 1.05* 0.91 0.88  LATICACIDVEN  --  1.5  --   --   --   --   --   VANCOTROUGH  --   --   --   --   --   --  8*    Estimated Creatinine Clearance: 65.4 mL/min (by C-G formula based on SCr of 0.88 mg/dL).    Allergies  Allergen Reactions   Astemizole Other (See Comments)    Unknown reaction   Penicillins     Unknown reaction    Prochlorperazine Edisylate     Unknown reaction    Tolectin [Tolmetin Sodium]     Unknown reaction     Antimicrobials this admission: Cefepime/metronidazole 7/19 x1 Ceftriaxone 7/20 >>  Vancomycin 7/19 >>   Acyclovir 7/20 >>   Dose adjustments this admission: Increase vancomycin to 1500 mg every 12 hours  Microbiology results: 7/20 respiratory panel: neg 7/19 Bld cx x2:- ngtd 7/19 urine cx: < 10,000 colonies insignificant growth  Thank you for involving pharmacy in the patient's care.   Theotis Burrow,  PharmD PGY1 Acute Care Pharmacy Resident  05/22/2023 1:33 PM

## 2023-05-23 DIAGNOSIS — E340 Carcinoid syndrome: Secondary | ICD-10-CM | POA: Diagnosis not present

## 2023-05-23 DIAGNOSIS — C7B8 Other secondary neuroendocrine tumors: Secondary | ICD-10-CM | POA: Diagnosis not present

## 2023-05-23 LAB — CULTURE, BLOOD (ROUTINE X 2)
Culture: NO GROWTH
Culture: NO GROWTH
Special Requests: ADEQUATE

## 2023-05-23 LAB — CBC
HCT: 35.6 % — ABNORMAL LOW (ref 36.0–46.0)
Hemoglobin: 12.5 g/dL (ref 12.0–15.0)
MCH: 34.1 pg — ABNORMAL HIGH (ref 26.0–34.0)
MCHC: 35.1 g/dL (ref 30.0–36.0)
MCV: 97 fL (ref 80.0–100.0)
Platelets: 123 10*3/uL — ABNORMAL LOW (ref 150–400)
RBC: 3.67 MIL/uL — ABNORMAL LOW (ref 3.87–5.11)
RDW: 12.2 % (ref 11.5–15.5)
WBC: 5.5 10*3/uL (ref 4.0–10.5)
nRBC: 0 % (ref 0.0–0.2)

## 2023-05-23 LAB — VDRL, CSF: VDRL Quant, CSF: NONREACTIVE

## 2023-05-23 LAB — CSF CULTURE W GRAM STAIN: Culture: NO GROWTH

## 2023-05-23 LAB — CULTURE, FUNGUS WITHOUT SMEAR

## 2023-05-23 NOTE — Progress Notes (Signed)
S: Patient reports that she is feeling somewhat better.  She was able to walk the halls without assistance.  Headaches are significantly better.  She is hoping to go home soon. Continues to have diarrhea.  Oncology History  Metastatic carcinoid tumor (HCC)  11/2012 Initial Diagnosis   Right hemicolectomy and bilateral salpingo-oophorectomy: 3 cm grade 2 well-differentiated neuroendocrine tumor lymphovascular invasion and perineural invasion present, 6/17 lymph nodes positive, 3 mitosis per 10 high-power fields Ki-67 2 to 5% appendix positive, small bowel mesenteric nodule positive, right and left ovaries positive T4 N1 M1   03/2014 Imaging   Liver metastases   04/2016 Imaging   Dotatate PET: Left subdiaphragmatic lesion 1 cm   04/2016 Miscellaneous   Sandostatin LAR continued till October 2019 and then she was lost to follow-up   08/07/2017 Imaging   Redemonstration of multiple dotatate avid lesions in the liver suggestive of residual neuroendocrine metastases uptake has decreased   08/23/2017 Miscellaneous   Until a 02/14/2018 4 doses of Lutathera given, Sandostatin continued (all the above care was done at [facility] and then transferred her care to [facility] who started on Telotristat Ethyl for diarrhea orally   08/17/2018 Imaging   Dotatate PET: Similar distribution liver and left phrenic lymph node mild uptake in right supraclavicular lymph node, 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   09/16/2021 Miscellaneous   206 mCI of lutetium LU 177 dotatate treatment (lysine and arginine amino acids were infused)  Third infusion given on 02/10/2022       Latest Ref Rng & Units 05/23/2023   10:46 AM 05/20/2023    3:55 AM 05/19/2023    5:59 PM  CBC  WBC 4.0 - 10.5 K/uL 5.5  8.6  8.5   Hemoglobin 12.0 - 15.0 g/dL 19.1  47.8  29.5   Hematocrit 36.0 - 46.0 % 35.6  34.3  35.0   Platelets 150 - 400 K/uL 123  103  90       Latest Ref Rng & Units 05/22/2023     8:20 AM 05/21/2023    8:33 AM 05/20/2023    3:55 AM  CMP  Glucose 70 - 99 mg/dL 621  308  657   BUN 8 - 23 mg/dL 8  10  16    Creatinine 0.44 - 1.00 mg/dL 8.46  9.62  9.52   Sodium 135 - 145 mmol/L 142  135  136   Potassium 3.5 - 5.1 mmol/L 3.4  2.8  3.9   Chloride 98 - 111 mmol/L 102  101  99   CO2 22 - 32 mmol/L 30  27  24    Calcium 8.9 - 10.3 mg/dL 8.4  8.1  8.1   Total Protein 6.5 - 8.1 g/dL   5.5   Total Bilirubin 0.3 - 1.2 mg/dL   1.0   Alkaline Phos 38 - 126 U/L   48   AST 15 - 41 U/L   37   ALT 0 - 44 U/L   26    Assessment and plan: 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  She tells [place] that Sandostatin did not work for her.  I discussed with her that I was able to receive her records from her oncologist in [place]. headaches and nausea: Spinal tap was performed yesterday and cytology is still pending.  Lots of lymphocytes were present suggestive of viral process.  ID is on board. After discharge from the hospital patient will be followed by GI oncology: Either Dr. Myrle Sheng or Dr. Mosetta Putt

## 2023-05-23 NOTE — TOC Transition Note (Signed)
Transition of Care [facility]) - CM/SW Discharge Note   Patient Details  Name: Anna Cortez MRN: [id number] Date of Birth: 1945/11/27  [facility] CM/SW Contact:  Kermit Balo, RN Phone Number: 05/23/2023, 3:41 PM   Clinical Narrative:     Pt is from home alone. She says her sister lives close by and can provide some supervision.  She normally drives but says her sister will assist in transportation. Pt wasn't taking any medications at home. No DME. Walker for home ordered through [employer] and will be delivered to the room. Home health arranged with [facility]. Information on the AVS.  Sister will transport home when medically ready.    Barriers to Discharge: Continued Medical Work up   Patient Goals and CMS Choice CMS Medicare.gov Compare Post Acute Care list provided to:: Patient Choice offered to / list presented to : Patient  Discharge Placement                         Discharge Plan and Services Additional resources added to the After Visit Summary for     Discharge Planning Services: CM Consult            DME Arranged: Dan Humphreys rolling DME Agency: [employer] Date DME Agency Contacted: 05/23/23   Representative spoke with at DME Agency: Timothy Lasso HH Arranged: PT HH Agency: [facility] Date [facility] Agency Contacted: 05/23/23   Representative spoke with at [facility] Agency: Tresa Endo  Social Determinants of Health (SDOH) Interventions SDOH Screenings   Food Insecurity: No Food Insecurity (05/22/2023)  Housing: Low Risk  (05/22/2023)  Transportation Needs: No Transportation Needs (05/22/2023)  Utilities: Not At Risk (05/22/2023)  Tobacco Use: Low Risk  (05/18/2023)     Readmission Risk Interventions     No data to display

## 2023-05-23 NOTE — Evaluation (Addendum)
Occupational Therapy Evaluation Patient Details Name: [name] MRN: [id number] DOB: [birth year] Today's Date: 05/23/2023   History of Present Illness 77yo female who presented to [facility] ED multiple times due to N/V, HA, AMS. CXR, CTH, chest CT, pelvic CT, head MRI all negative for acute injury/changes. MRI of abdomem showed 6 liver lesion. Lumbar puncture was attempted but unsuccessful. PMH DDD, insomnia, OA, R knee surgery, anxiety, HTN, HLD, carciniod tumor of appendix, MDD, CKD   Clinical Impression   PTA, pt lives alone and typically completely independent, working for highway inspection. Pt presents now with minor deficits in strength and higher level dynamic standing balance. Pt reports significant improvements in cognition from initiation admission. Pt given permission from MD for showering task; able to complete in room mobility, shower transfer and showering task with Supervision. Pt opted to use BSC as shower chair for fall prevention and energy conservation. Anticipate no OT needs at DC but will continue to follow acutely to ensure independence with daily tasks.       Recommendations for follow up therapy are one component of a multi-disciplinary discharge planning process, led by the attending physician.  Recommendations may be updated based on patient status, additional functional criteria and insurance authorization.   Assistance Recommended at Discharge PRN  Patient can return home with the following Assistance with cooking/housework;Assist for transportation    Functional Status Assessment  Patient has had a recent decline in their functional status and demonstrates the ability to make significant improvements in function in a reasonable and predictable amount of time.  Equipment Recommendations  Other (comment);Tub/shower seat (RW)    Recommendations for Other Services       Precautions / Restrictions Precautions Precautions: Fall Restrictions Weight Bearing  Restrictions: No      Mobility Bed Mobility               General bed mobility comments: in chair on entry    Transfers Overall transfer level: Needs assistance Equipment used: None Transfers: Sit to/from Stand Sit to Stand: Supervision                  Balance                                           ADL either performed or assessed with clinical judgement   ADL Overall ADL's : Needs assistance/impaired Eating/Feeding: Independent   Grooming: Supervision/safety;Standing;Oral care   Upper Body Bathing: Set up   Lower Body Bathing: Supervison/ safety;Sitting/lateral leans;Sit to/from stand Lower Body Bathing Details (indicate cue type and reason): showering during session; providewd BSC for pt use, able to sit and wash feet safely this way Upper Body Dressing : Set up   Lower Body Dressing: Supervision/safety;Sitting/lateral leans;Sit to/from stand   Toilet Transfer: Supervision/safety;Ambulation   Toileting- Clothing Manipulation and Hygiene: Supervision/safety       Functional mobility during ADLs: Min guard General ADL Comments: minor weakness and impaired higher level dynamic standing balance. able to manage ADLs without physical assist though likley some challenges with IADLs     Vision Baseline Vision/History: 1 Wears glasses Ability to See in Adequate Light: 0 Adequate Patient Visual Report: No change from baseline Vision Assessment?: No apparent visual deficits     Perception     Praxis      Pertinent Vitals/Pain Pain Assessment Pain Assessment: No/denies pain     Hand  Dominance Right   Extremity/Trunk Assessment Upper Extremity Assessment Upper Extremity Assessment: Generalized weakness (some coordination issues)   Lower Extremity Assessment Lower Extremity Assessment: Defer to PT evaluation   Cervical / Trunk Assessment Cervical / Trunk Assessment: Normal   Communication Communication Communication: No  difficulties   Cognition Arousal/Alertness: Awake/alert Behavior During Therapy: WFL for tasks assessed/performed Overall Cognitive Status: Within Functional Limits for tasks assessed                                       General Comments       Exercises     Shoulder Instructions      Home Living Family/patient expects to be discharged to:: Private residence Living Arrangements: Alone Available Help at Discharge: Family;Available PRN/intermittently Type of Home: House Home Access: Level entry     Home Layout: One level     Bathroom Shower/Tub: Tub/shower unit;Walk-in shower   Bathroom Toilet: Standard     Home Equipment: None          Prior Functioning/Environment Prior Level of Function : Independent/Modified Independent;Working/employed;Driving             Mobility Comments: reports no concerns- was driving, performing all home tasks and ADLs ADLs Comments: works in Biomedical [employer] Problem List: Decreased strength;Decreased activity tolerance;Impaired balance (sitting and/or standing)      OT Treatment/Interventions: Self-care/ADL training;Therapeutic exercise;Energy conservation;DME and/or AE instruction;Therapeutic activities;Patient/family education    OT Goals(Current goals can be found in the care plan section) Acute Rehab OT Goals Patient Stated Goal: figure out what is wrong OT Goal Formulation: With patient Time For Goal Achievement: 06/06/23 Potential to Achieve Goals: Good ADL Goals Pt/caregiver will Perform Home Exercise Program: Increased strength;With theraband;Independently;With written HEP provided;Both right and left upper extremity Additional ADL Goal #1: Pt to verbalize at least 3 fall prevention strategies to implement at home Additional ADL Goal #2: Pt to increase standing tolerance during ADLs/IADLs > 10 min without seated rest break  OT Frequency: Min 1X/week    Co-evaluation               AM-PAC OT "6 Clicks" Daily Activity     Outcome Measure Help from another person eating meals?: None Help from another person taking care of personal grooming?: A Little Help from another person toileting, which includes using toliet, bedpan, or urinal?: A Little Help from another person bathing (including washing, rinsing, drying)?: A Little Help from another person to put on and taking off regular upper body clothing?: A Little Help from another person to put on and taking off regular lower body clothing?: A Little 6 Click Score: 19   End of Session Nurse Communication: Mobility status  Activity Tolerance: Patient tolerated treatment well Patient left: in chair;with call bell/phone within reach;with chair alarm set  OT Visit Diagnosis: Unsteadiness on feet (R26.81);Other abnormalities of gait and mobility (R26.89);Muscle weakness (generalized) (M62.81)                Time: 0630-1601 OT Time Calculation (min): 35 min Charges:  OT General Charges $OT Visit: 1 Visit OT Evaluation $OT Eval Low Complexity: 1 Low OT Treatments $Self Care/Home Management : 8-22 mins  Bradd Canary, OTR/L Acute Rehab Services Office: [phone]   Lorre Munroe 05/23/2023, 11:44 AM

## 2023-05-23 NOTE — Progress Notes (Signed)
PROGRESS NOTE    Anna Cortez  ZOX:[id number] DOB: 03/15/1946 DOA: 05/18/2023 PCP: Edwyna Perfect, MD (Inactive)   Brief Narrative:  Anna Cortez is a 77 y.o. 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   Patient has been evaluated by multiple facilities over the past few days with complaints of nausea vomiting and headache improving with supportive care ultimately discharged back home given improvement in symptoms.  Symptoms continue to worsen uncontrolled by supportive care patient presented back to the hospital 7/18 for repeat evaluation and treatment.  Hospitalist called for admission.  Assessment & Plan:   Principal Problem:   Altered mental status Active Problems:   Anxiety state   Essential hypertension   GERD (gastroesophageal reflux disease)   Hyperlipidemia   Carcinoid tumor of appendix   Severe episode of recurrent major depressive disorder, without psychotic features (HCC)   Stage 3 chronic kidney disease (HCC)   Acute metabolic encephalopathy   Metastatic carcinoid tumor (HCC)   Carcinoid syndrome (HCC)   Meningismus  Altered mental status Acute intractable headache SIRS without source - unlikely encephalitis/meningitis Slurred speech - CVA ruled out - Patient presenting with fever, headache, leukocytosis with no clear source - imaging negative and LP unremarkable for overt infection, possibly viral but given negative meningitis panel most common viruses are ruled out - Discussed with ID -continue to hold antibiotics/antivirals -follow clinically - Meningitis less likely given no nuchal rigidity with waxing and waning symptoms, notably mental status transiently altered around narcotics per sister - Imaging thus far has been unrevealing including MRI  CT of the head as well as CT of the chest abdomen pelvis. - MRI abdomen remarkable for 6+ lesions concerning for metastatic disease -LP successful with Dr. Pamelia Hoit 7/23, appreciate his insight and assistance -Polypharmacy/narcotics likely playing a major role in patient's mental status changes; but would not explain her fever leukocytosis or tremors -Blood culture negative thus far, respiratory pathogen panel unremarkable; less than 10K colonies on urine culture.  -CSF cultures negative preliminary, meningitis panel negative CSF minimally elevated white count not consistent with bacterial meningitis, questionably viral but again patient continues to improve off antibiotics/antiviral medication  History of carcinoid tumor Rule out acute carcinoid syndrome vs crisis - Status post radiotherapy with lutathera radionucleotide therapy likely incomplete treatment given patient quit following up May 2023 now with MRI showing multiple liver lesions consistent with mets. -Discussed case with Dr. Pamelia Hoit, this would be an atypical presentation for carcinoid crisis, typically hypotensive with flushing, bronchospasm  - patient presents with headache tremors altered mental status with only transient episode of hypotension and bradycardia. -Neuroendocrine labs pending, levels for gastrin, glucagon, insulin, serotonin, vasoactive intestinal polypeptide are still pending, likely send outs, unclear turnaround time.  CKD 3b -Appears to be better than baseline -Labs currently WNL  Hypertension Hyperlipidemia GERD - History of the above but not currently on any medications  DVT prophylaxis: enoxaparin (LOVENOX) injection 40 mg Start: 05/19/23 1745 Code Status:   Code Status: Full Code Family Communication: Bonita Quin updated over the phone  Status is: Inpatient  Dispo: The patient is from: Home              Anticipated d/c is to: To be determined              Anticipated d/c date is: To be determined  Patient currently not medically stable for discharge  Consultants:  Oncology  Procedures:  LP attempted, unsuccessful  Antimicrobials:  Ceftriaxone, vancomycin, acyclovir Stopped 05/22/23  Subjective: No acute issues or events overnight, patient much more awake alert oriented this morning requesting to be allowed out of bed, requesting shower.  Denies any further headaches nausea vomiting diarrhea constipation chest pain shortness of breath  Objective: Vitals:   05/22/23 2346 05/23/23 0417 05/23/23 0500 05/23/23 0718  BP: (!) 135/54 (!) 143/60  (!) 152/64  Pulse: (!) 59 (!) 57  (!) 43  Resp: 17 17  18   Temp: 98.6 F (37 C) 97.8 F (36.6 C)  97.9 F (36.6 C)  TempSrc: Oral Oral  Oral  SpO2: 94% 97%  99%  Weight:   86.3 kg   Height:        Intake/Output Summary (Last 24 hours) at 05/23/2023 0806 Last data filed at 05/23/2023 0600 Gross per 24 hour  Intake 200 ml  Output --  Net 200 ml   Filed Weights   05/21/23 0447 05/22/23 0417 05/23/23 0500  Weight: 89.4 kg 88.3 kg 86.3 kg    Examination:  General: Awake alert oriented x 3 without any further tremors, speaking clearly in full sentences. HEENT: Pupils equal round reactive to light. Neck:  Without mass or deformity.  Trachea is midline.  Without nuchal rigidity Lungs: Without rales rhonchi or wheeze. Heart: Regular rate and rhythm Abdomen:  Soft, nontender, nondistended Extremities: Strength 5 out of 5 globally without focal deficit.  Data Reviewed: I have personally reviewed following labs and imaging studies  CBC: Recent Labs  Lab 05/17/23 1522 05/18/23 2033 05/19/23 1759 05/20/23 0355  WBC 8.6 9.2 8.5 8.6  NEUTROABS  --  7.1  --   --   HGB 14.0 13.0 12.0 11.5*  HCT 40.2 37.7 35.0* 34.3*  MCV 96.4 96.9 99.7 98.6  PLT 164 135* 90* 103*   Basic Metabolic Panel: Recent Labs  Lab 05/18/23 2033 05/19/23 1013 05/19/23 1759 05/20/23 0355 05/21/23 0833 05/22/23 0820  NA 138  --  137 136 135 142  K  3.7  --  3.8 3.9 2.8* 3.4*  CL 103  --  102 99 101 102  CO2 25  --  25 24 27 30   GLUCOSE 158*  --  108* 107* 134* 107*  BUN 21  --  18 16 10 8   CREATININE 1.16* 0.69 1.04* 1.05* 0.91 0.88  CALCIUM 9.0  --  8.1* 8.1* 8.1* 8.4*   GFR: Estimated Creatinine Clearance: 64.7 mL/min (by C-G formula based on SCr of 0.88 mg/dL). Liver Function Tests: Recent Labs  Lab 05/17/23 1522 05/18/23 2033 05/19/23 1759 05/20/23 0355  AST 19 20 33 37  ALT 16 16 26 26   ALKPHOS 76 53 46 48  BILITOT 1.0 0.9 0.6 1.0  PROT 7.5 7.0 5.4* 5.5*  ALBUMIN 4.4 4.5 2.8* 3.0*   Recent Labs  Lab 05/17/23 1522  LIPASE 12   Recent Labs  Lab 05/19/23 0149  AMMONIA 15   Coagulation Profile: Recent Labs  Lab 05/18/23 2034  INR 1.1   Cardiac Enzymes: Recent Labs  Lab 05/18/23 2033  CKTOTAL 220   Sepsis Labs: Recent Labs  Lab 05/18/23 2033  LATICACIDVEN 1.5    Recent Results (from the past 240 hour(s))  Resp panel by RT-PCR (RSV, Flu A&B, Covid) Anterior Nasal Swab     Status: None   Collection Time: 05/17/23  3:22 PM   Specimen: Anterior Nasal Swab  Result Value Ref Range Status   SARS Coronavirus 2 by RT PCR NEGATIVE NEGATIVE Final    Comment: (NOTE) SARS-CoV-2 target nucleic acids are NOT DETECTED.  The SARS-CoV-2 RNA is generally detectable in upper respiratory specimens during the acute phase of infection. The lowest concentration of SARS-CoV-2 viral copies this assay can detect is 138 copies/mL. A negative result does not preclude SARS-Cov-2 infection and should not be used as the sole basis for treatment or other patient management decisions. A negative result may occur with  improper specimen collection/handling, submission of specimen other than nasopharyngeal swab, presence of viral mutation(s) within the areas targeted by this assay, and inadequate number of viral copies(<138 copies/mL). A negative result must be combined with clinical observations, patient history, and  epidemiological information. The expected result is Negative.  Fact Sheet for Patients:  BloggerCourse.com  Fact Sheet for Healthcare Providers:  SeriousBroker.it  This test is no t yet approved or cleared by the [place] FDA and  has been authorized for detection and/or diagnosis of SARS-CoV-2 by FDA under an Emergency Use Authorization (EUA). This EUA will remain  in effect (meaning this test can be used) for the duration of the COVID-19 declaration under Section 564(b)(1) of the Act, 21 U.S.C.section 360bbb-3(b)(1), unless the authorization is terminated  or revoked sooner.       Influenza A by PCR NEGATIVE NEGATIVE Final   Influenza B by PCR NEGATIVE NEGATIVE Final    Comment: (NOTE) The Xpert Xpress SARS-CoV-2/FLU/RSV plus assay is intended as an aid in the diagnosis of influenza from Nasopharyngeal swab specimens and should not be used as a sole basis for treatment. Nasal washings and aspirates are unacceptable for Xpert Xpress SARS-CoV-2/FLU/RSV testing.  Fact Sheet for Patients: BloggerCourse.com  Fact Sheet for Healthcare Providers: SeriousBroker.it  This test is not yet approved or cleared by the [place] FDA and has been authorized for detection and/or diagnosis of SARS-CoV-2 by FDA under an Emergency Use Authorization (EUA). This EUA will remain in effect (meaning this test can be used) for the duration of the COVID-19 declaration under Section 564(b)(1) of the Act, 21 U.S.C. section 360bbb-3(b)(1), unless the authorization is terminated or revoked.     Resp Syncytial Virus by PCR NEGATIVE NEGATIVE Final    Comment: (NOTE) Fact Sheet for Patients: BloggerCourse.com  Fact Sheet for Healthcare Providers: SeriousBroker.it  This test is not yet approved or cleared by the [place] FDA and has been  authorized for detection and/or diagnosis of SARS-CoV-2 by FDA under an Emergency Use Authorization (EUA). This EUA will remain in effect (meaning this test can be used) for the duration of the COVID-19 declaration under Section 564(b)(1) of the Act, 21 U.S.C. section 360bbb-3(b)(1), unless the authorization is terminated or revoked.  Performed at [facility], [address], [place], [place] [address]   Blood culture (routine x 2)     Status: None (Preliminary result)   Collection Time: 05/18/23  8:34 PM   Specimen: BLOOD  Result Value Ref Range Status   Specimen Description   Final    BLOOD RIGHT ANTECUBITAL Performed at [facility], [address] [place], [place] [address]    Special Requests   Final    BOTTLES DRAWN AEROBIC AND ANAEROBIC Blood Culture adequate volume Performed at [facility], [address] [place], [place] [address]    Culture   Final    NO GROWTH 4 DAYS Performed at [facility], [address]  [place], [place] [address]    Report Status PENDING  Incomplete  Blood culture (routine x 2)     Status: None (Preliminary result)   Collection Time: 05/18/23  9:18 PM   Specimen: BLOOD  Result Value Ref Range Status   Specimen Description   Final    BLOOD BLOOD RIGHT HAND Performed at [facility], [address], [place], [place] [address]    Special Requests   Final    BOTTLES DRAWN AEROBIC AND ANAEROBIC Blood Culture adequate volume Performed at [facility], [address], [place], [place] [address]    Culture   Final    NO GROWTH 3 DAYS Performed at [facility], [address] [place], [place] [address]    Report Status PENDING  Incomplete  Resp panel by RT-PCR (RSV, Flu A&B, Covid) Anterior Nasal Swab     Status: None   Collection Time: 05/18/23 10:17 PM   Specimen: Anterior Nasal Swab  Result Value Ref Range Status   SARS  Coronavirus 2 by RT PCR NEGATIVE NEGATIVE Final    Comment: (NOTE) SARS-CoV-2 target nucleic acids are NOT DETECTED.  The SARS-CoV-2 RNA is generally detectable in upper respiratory specimens during the acute phase of infection. The lowest concentration of SARS-CoV-2 viral copies this assay can detect is 138 copies/mL. A negative result does not preclude SARS-Cov-2 infection and should not be used as the sole basis for treatment or other patient management decisions. A negative result may occur with  improper specimen collection/handling, submission of specimen other than nasopharyngeal swab, presence of viral mutation(s) within the areas targeted by this assay, and inadequate number of viral copies(<138 copies/mL). A negative result must be combined with clinical observations, patient history, and epidemiological information. The expected result is Negative.  Fact Sheet for Patients:  BloggerCourse.com  Fact Sheet for Healthcare Providers:  SeriousBroker.it  This test is no t yet approved or cleared by the [place] FDA and  has been authorized for detection and/or diagnosis of SARS-CoV-2 by FDA under an Emergency Use Authorization (EUA). This EUA will remain  in effect (meaning this test can be used) for the duration of the COVID-19 declaration under Section 564(b)(1) of the Act, 21 U.S.C.section 360bbb-3(b)(1), unless the authorization is terminated  or revoked sooner.       Influenza A by PCR NEGATIVE NEGATIVE Final   Influenza B by PCR NEGATIVE NEGATIVE Final    Comment: (NOTE) The Xpert Xpress SARS-CoV-2/FLU/RSV plus assay is intended as an aid in the diagnosis of influenza from Nasopharyngeal swab specimens and should not be used as a sole basis for treatment. Nasal washings and aspirates are unacceptable for Xpert Xpress SARS-CoV-2/FLU/RSV testing.  Fact Sheet for  Patients: BloggerCourse.com  Fact Sheet for Healthcare Providers: SeriousBroker.it  This test is not yet approved or cleared by the [place] FDA and has been authorized for detection and/or diagnosis of SARS-CoV-2 by FDA under an Emergency Use Authorization (EUA). This EUA will remain in effect (meaning this test can be used) for the duration of the COVID-19 declaration under Section 564(b)(1) of the Act, 21 U.S.C. section 360bbb-3(b)(1), unless the authorization is terminated or revoked.     Resp Syncytial Virus by PCR NEGATIVE NEGATIVE Final    Comment: (NOTE) Fact Sheet for Patients: BloggerCourse.com  Fact Sheet for Healthcare Providers: SeriousBroker.it  This test is not yet approved or cleared by the [place] FDA and has been authorized for detection and/or diagnosis of SARS-CoV-2 by FDA under  an Emergency Use Authorization (EUA). This EUA will remain in effect (meaning this test can be used) for the duration of the COVID-19 declaration under Section 564(b)(1) of the Act, 21 U.S.C. section 360bbb-3(b)(1), unless the authorization is terminated or revoked.  Performed at [facility], [address] [place], [place] [address]   Urine Culture     Status: Abnormal   Collection Time: 05/18/23 11:09 PM   Specimen: Urine, Random  Result Value Ref Range Status   Specimen Description   Final    URINE, RANDOM Performed at [facility], [address], [place], [place] [address]    Special Requests   Final    NONE Reflexed from [id number] Performed at [facility], [address], [place], [place] [address]    Culture (A)  Final    <10,000 COLONIES/mL INSIGNIFICANT GROWTH Performed at [facility], [address] [place], [place] [address]    Report Status 05/20/2023 FINAL  Final  Respiratory (~20  pathogens) panel by PCR     Status: None   Collection Time: 05/19/23  6:23 PM   Specimen: Nasopharyngeal Swab; Respiratory  Result Value Ref Range Status   Adenovirus NOT DETECTED NOT DETECTED Final   Coronavirus 229E NOT DETECTED NOT DETECTED Final    Comment: (NOTE) The Coronavirus on the Respiratory Panel, DOES NOT test for the novel  Coronavirus (2019 nCoV)    Coronavirus HKU1 NOT DETECTED NOT DETECTED Final   Coronavirus NL63 NOT DETECTED NOT DETECTED Final   Coronavirus OC43 NOT DETECTED NOT DETECTED Final   Metapneumovirus NOT DETECTED NOT DETECTED Final   Rhinovirus / Enterovirus NOT DETECTED NOT DETECTED Final   Influenza A NOT DETECTED NOT DETECTED Final   Influenza B NOT DETECTED NOT DETECTED Final   Parainfluenza Virus 1 NOT DETECTED NOT DETECTED Final   Parainfluenza Virus 2 NOT DETECTED NOT DETECTED Final   Parainfluenza Virus 3 NOT DETECTED NOT DETECTED Final   Parainfluenza Virus 4 NOT DETECTED NOT DETECTED Final   Respiratory Syncytial Virus NOT DETECTED NOT DETECTED Final   Bordetella pertussis NOT DETECTED NOT DETECTED Final   Bordetella Parapertussis NOT DETECTED NOT DETECTED Final   Chlamydophila pneumoniae NOT DETECTED NOT DETECTED Final   Mycoplasma pneumoniae NOT DETECTED NOT DETECTED Final    Comment: Performed at [facility], [address] [place], [place] [address]  CSF culture w Gram Stain     Status: None (Preliminary result)   Collection Time: 05/22/23  4:21 PM   Specimen: CSF; Cerebrospinal Fluid  Result Value Ref Range Status   Specimen Description CSF  Final   Special Requests NONE  Final   Gram Stain   Final    WBC PRESENT, PREDOMINANTLY MONONUCLEAR NO ORGANISMS SEEN CYTOSPIN SMEAR Performed at [facility], [address], [place], [place] [address]    Culture PENDING  Incomplete   Report Status PENDING  Incomplete         Radiology Studies: MR ABDOMEN WO CONTRAST  Result Date: 05/22/2023 CLINICAL DATA:  Carcinoid  tumor of the appendix. EXAM: MRI ABDOMEN WITHOUT CONTRAST TECHNIQUE: Multiplanar multisequence MR imaging was performed without the administration of intravenous contrast. COMPARISON:  CT 05/18/2023 and older FINDINGS: Lower chest: Development of small bilateral pleural effusions. Heart is slightly enlarged. Breathing motion. Hepatobiliary: There is some subtle liver lesions identified as depicted on diffusion. Example segment 7 of the liver posterolateral to the IVC and posterior to the branches of the portal and hepatic  veins has a correlate on series 16, image 38 measuring 11 by 10 mm. On diffusion, there are up to 6 lesion suggested. The other lesions are smaller in segment 4, 5, 7 and 2. With the patient's history these to be concerning for potential metastatic lesions. There is some underlying fatty liver infiltration on out of phase imaging of the liver. No biliary ductal dilatation. Multiple stones in the nondilated gallbladder. Pancreas: Global pancreatic atrophy. No obvious mass on diffusion or precontrast T1. Spleen:  Within normal limits in size and appearance. Adrenals/Urinary Tract: No masses identified. No evidence of hydronephrosis. Stomach/Bowel: Surgical changes of previous right hemicolectomy. The visualized portions of the small and large bowel and stomach are nondilated. Vascular/Lymphatic: No pathologically enlarged lymph nodes identified. No abdominal aortic aneurysm demonstrated. Other:  Anasarca.  No ascites. Musculoskeletal: Curvature and degenerative changes of the spine. IMPRESSION: Up to 6 small lesion seen in the liver, best depicted on diffusion. Largest is seen in segment 7. These are incompletely evaluated without contrast but with the patient's history in the overall appearance worrisome for potential metastatic lesions. A dynamic postcontrast dataset may be useful to assess for arterial enhancement when clinically appropriate to further delineate. Small pleural effusions.   Anasarca. Electronically Signed   By: Karen Kays M.D.   On: 05/22/2023 11:24    Scheduled Meds:  enoxaparin (LOVENOX) injection  40 mg Subcutaneous Q24H   sodium chloride flush  3 mL Intravenous Q12H   Continuous Infusions:   LOS: 3 days   Time spent:  Azucena Fallen, DO [facility]  If 7PM-7AM, please contact night-coverage [email or web address]  05/23/2023, 8:06 AM

## 2023-05-23 NOTE — Progress Notes (Signed)
Called [facility] at [phone] per MD request and asked for most recent office note/imaging. Per staff, most recent note/imaging is from 2022. Gave fax number, staff verbalized understanding.

## 2023-05-23 NOTE — Evaluation (Signed)
Physical Therapy Evaluation Patient Details Name: Anna Cortez MRN: [id number] DOB: 1946-07-12 Today's Date: 05/23/2023  History of Present Illness  77yo female who presented to [facility] ED multiple times due to N/V, HA, AMS. CXR, CTH, chest CT, pelvic CT, head MRI all negative for acute injury/changes. MRI of abdomem showed 6 liver lesion. Lumbar puncture was attempted but unsuccessful. PMH DDD, insomnia, OA, R knee surgery, anxiety, HTN, HLD, carciniod tumor of appendix, MDD, CKD  Clinical Impression   Pt received in bed pleasant and cooperative today. Able to ambulate in her room with general SBA/no device and very mild unsteadiness, we also tried hallway ambulation without device and she did well but fatigued fairly quickly. Unsteadiness tends to increase along with fatigue, as expected but she needed no more than min guard with mobility today. Handed off to OT at end of PT eval, all needs otherwise met. Will continue to see her in house, do anticipate she will need post-acute care rehabilitation to assist in returning to [facility] when medically ready for DC.         Assistance Recommended at Discharge Intermittent Supervision/Assistance  If plan is discharge home, recommend the following:  Can travel by private vehicle  Assistance with cooking/housework;Assist for transportation;A little help with bathing/dressing/bathroom;Help with stairs or ramp for entrance        Equipment Recommendations Rolling walker (2 wheels)  Recommendations for Other Services       Functional Status Assessment Patient has had a recent decline in their functional status and demonstrates the ability to make significant improvements in function in a reasonable and predictable amount of time.     Precautions / Restrictions Precautions Precautions: Fall Restrictions Weight Bearing Restrictions: No      Mobility  Bed Mobility Overal bed mobility: Modified Independent             General bed mobility  comments: increased time/effort    Transfers Overall transfer level: Needs assistance Equipment used: None Transfers: Sit to/from Stand Sit to Stand: Supervision           General transfer comment: distant S for safety, no physical assist given    Ambulation/Gait Ambulation/Gait assistance: Min guard Gait Distance (Feet): 150 Feet Assistive device: None Gait Pattern/deviations: Step-through pattern, Decreased step length - right, Decreased step length - left, Trunk flexed       General Gait Details: very slow and mildly unsteady gait pattern, fairly fatigued at end of gait distance; gait speed fairly slow  Stairs            Wheelchair Mobility     Tilt Bed    Modified Rankin (Stroke Patients Only)       Balance Overall balance assessment: Mild deficits observed, not formally tested                                           Pertinent Vitals/Pain Pain Assessment Pain Assessment: Faces Faces Pain Scale: Hurts a little bit (HA)    Home Living Family/patient expects to be discharged to:: Private residence Living Arrangements: Alone Available Help at Discharge: Family;Available PRN/intermittently Type of Home: House Home Access: Level entry       Home Layout: One level Home Equipment: None      Prior Function Prior Level of Function : Independent/Modified Independent             Mobility Comments:  reports no concerns- was driving, performing all home tasks and ADLs       Hand Dominance        Extremity/Trunk Assessment   Upper Extremity Assessment Upper Extremity Assessment: Defer to OT evaluation    Lower Extremity Assessment Lower Extremity Assessment: Generalized weakness    Cervical / Trunk Assessment Cervical / Trunk Assessment: Normal  Communication   Communication: No difficulties  Cognition Arousal/Alertness: Awake/alert Behavior During Therapy: WFL for tasks assessed/performed Overall Cognitive  Status: Within Functional Limits for tasks assessed                                          General Comments      Exercises     Assessment/Plan    PT Assessment Patient needs continued PT services  PT Problem List Decreased strength;Decreased balance;Decreased mobility;Cardiopulmonary status limiting activity;Decreased activity tolerance;Decreased coordination       PT Treatment Interventions DME instruction;Functional mobility training;Balance training;Patient/family education;Gait training;Therapeutic activities;Neuromuscular re-education;Stair training;Therapeutic exercise    PT Goals (Current goals can be found in the Care Plan section)  Acute Rehab PT Goals Patient Stated Goal: feel better, find out what made me sick PT Goal Formulation: With patient Time For Goal Achievement: 06/06/23 Potential to Achieve Goals: Good    Frequency Min 1X/week     Co-evaluation               AM-PAC PT "6 Clicks" Mobility  Outcome Measure Help needed turning from your back to your side while in a flat bed without using bedrails?: None Help needed moving from lying on your back to sitting on the side of a flat bed without using bedrails?: None Help needed moving to and from a bed to a chair (including a wheelchair)?: None Help needed standing up from a chair using your arms (e.g., wheelchair or bedside chair)?: A Little Help needed to walk in hospital room?: A Little Help needed climbing 3-5 steps with a railing? : A Little 6 Click Score: 21    End of Session   Activity Tolerance: Patient tolerated treatment well Patient left: in chair;Other (comment) (OT present and attending) Nurse Communication: Mobility status PT Visit Diagnosis: Unsteadiness on feet (R26.81);Muscle weakness (generalized) (M62.81);Difficulty in walking, not elsewhere classified (R26.2)    Time: 1050-1102 PT Time Calculation (min) (ACUTE ONLY): 12 min   Charges:   PT Evaluation $PT  Eval Moderate Complexity: 1 Mod   PT General Charges $$ ACUTE PT VISIT: 1 Visit        Nedra Hai, PT, DPT 05/23/23 11:14 AM

## 2023-05-24 DIAGNOSIS — F411 Generalized anxiety disorder: Secondary | ICD-10-CM | POA: Diagnosis not present

## 2023-05-24 DIAGNOSIS — R291 Meningismus: Secondary | ICD-10-CM

## 2023-05-24 DIAGNOSIS — R4182 Altered mental status, unspecified: Secondary | ICD-10-CM | POA: Diagnosis not present

## 2023-05-24 DIAGNOSIS — E340 Carcinoid syndrome: Secondary | ICD-10-CM | POA: Diagnosis not present

## 2023-05-24 DIAGNOSIS — G9341 Metabolic encephalopathy: Secondary | ICD-10-CM | POA: Diagnosis not present

## 2023-05-24 LAB — ANAEROBIC CULTURE W GRAM STAIN

## 2023-05-24 LAB — GASTRIN: Gastrin: 48 pg/mL (ref 0–115)

## 2023-05-24 LAB — CULTURE, FUNGUS WITHOUT SMEAR

## 2023-05-24 LAB — GLUCAGON: Glucagon Lvl: 35 pg/mL (ref 13–159)

## 2023-05-24 LAB — CSF CULTURE W GRAM STAIN

## 2023-05-24 LAB — CHROMOGRANIN A: Chromogranin A (ng/mL): 68.8 ng/mL (ref 0.0–101.8)

## 2023-05-24 LAB — CYTOLOGY - NON PAP

## 2023-05-24 MED ORDER — ASPIRIN-ACETAMINOPHEN-CAFFEINE 250-250-65 MG PO TABS: 1.0000 | ORAL_TABLET | Freq: Four times a day (QID) | ORAL | 0 refills | Status: DC | PRN

## 2023-05-24 MED ORDER — ACETAMINOPHEN 325 MG PO TABS: 650.0000 mg | ORAL_TABLET | Freq: Four times a day (QID) | ORAL | 0 refills | Status: DC | PRN

## 2023-05-24 NOTE — Plan of Care (Signed)

## 2023-05-24 NOTE — Care Management Important Message (Signed)
Important Message  Patient Details  Name: Anna Cortez MRN: [id number] Date of Birth: 08/26/1946   Medicare Important Message Given:  Yes  Patient left prior to IM delivery will mail to the patient Home address.   [name] 05/24/2023, 1:42 PM

## 2023-05-24 NOTE — TOC Transition Note (Signed)
Transition of Care [facility]) - CM/SW Discharge Note   Patient Details  Name: Anna Cortez MRN: [id number] Date of Birth: [birth year]  Transition of Care [employer]) CM/SW Contact:  Kermit Balo, RN Phone Number: 05/24/2023, 9:50 AM   Clinical Narrative:    Pt is discharging home with home health services through [facility]. Information on the AVS.  Walker for home is at the bedside.  Pt has transport home.   Final next level of care: Home w Home Health Services Barriers to Discharge: No Barriers Identified   Patient Goals and CMS Choice CMS Medicare.gov Compare Post Acute Care list provided to:: Patient Choice offered to / list presented to : Patient  Discharge Placement                         Discharge Plan and Services Additional resources added to the After Visit Summary for     Discharge Planning Services: CM Consult            DME Arranged: Dan Humphreys rolling DME Agency: [employer] Date DME Agency Contacted: 05/23/23   Representative spoke with at DME Agency: Timothy Lasso HH Arranged: PT HH Agency: [facility] Date [facility] Agency Contacted: 05/23/23   Representative spoke with at [facility] Agency: Tresa Endo  Social Determinants of Health (SDOH) Interventions SDOH Screenings   Food Insecurity: No Food Insecurity (05/22/2023)  Housing: Low Risk  (05/22/2023)  Transportation Needs: No Transportation Needs (05/22/2023)  Utilities: Not At Risk (05/22/2023)  Tobacco Use: Low Risk  (05/18/2023)     Readmission Risk Interventions     No data to display

## 2023-05-24 NOTE — Discharge Summary (Signed)
Physician Discharge Summary  Anna Cortez QMV:[id number] DOB: September 03, 1946 DOA: 05/18/2023  PCP: Edwyna Perfect, MD (Inactive)  Admit date: 05/18/2023 Discharge date: 05/24/2023  Admitted From: Home Disposition:  Home  Recommendations for Outpatient Follow-up:  Follow up with PCP in 1-2 weeks Follow up with Oncology  Home Health:PT Equipment/Devices:None  Discharge Condition:Stable  CODE STATUS:Full  Diet recommendation:    Brief/Interim Summary: Anna Cortez is a 77 y.o. 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   Patient has been evaluated by multiple facilities over the past few days with complaints of nausea vomiting and headache improving with supportive care ultimately discharged back home given improvement in symptoms.  Symptoms continue to worsen uncontrolled by supportive care patient presented back to the hospital 7/18 for repeat evaluation and treatment.  Hospitalist called for admission.  Patient admitted as above with confusion, intractable headache, and high grade fever initially concerned for meningitis - LP obtained appears consistent with viral meningitis but HSV negative and she improved with supportive care. History of neuroendocrine tumor complicated her hospitalization - recommend close follow up with oncology as scheduled.  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 [facility])    Discharge Instructions   Allergies as of 05/24/2023       Reactions   Astemizole Other (See Comments)   Unknown reaction   Penicillins    Unknown reaction   Prochlorperazine Edisylate    Unknown reaction   Tolectin [tolmetin Sodium]    Unknown reaction        Medication List     STOP taking these medications    diazepam 5 MG tablet Commonly known as: VALIUM   ondansetron 4 MG disintegrating tablet Commonly known as: ZOFRAN-ODT       TAKE these medications    acetaminophen 325 MG tablet Commonly known as: TYLENOL Take 2 tablets (650 mg total) by mouth every 6 (six) hours as needed for mild pain (or Fever >/= 101).   aspirin-acetaminophen-caffeine 250-250-65 MG tablet Commonly known as: EXCEDRIN MIGRAINE Take 1 tablet by mouth every 6 (six) hours as needed for headache or migraine.        Follow-up Information     Hodgin, Acie Fredrickson, MD Follow up.   Specialty: Internal Medicine Contact information: [address] [place] [address] [phone]         [facility] Follow up.   Specialty: Home Health Services Why: The home health agency will contact you for the first home visit. Contact information: [address] [place] [address] [phone]                Allergies  Allergen Reactions   Astemizole Other (See Comments)    Unknown reaction   Penicillins     Unknown reaction    Prochlorperazine Edisylate     Unknown reaction    Tolectin [Tolmetin Sodium]  Unknown reaction     Consultations: Oncology, ID   Procedures/Studies: MR ABDOMEN WO CONTRAST  Result Date: 05/22/2023 CLINICAL DATA:  Carcinoid tumor of the appendix. EXAM: MRI ABDOMEN WITHOUT CONTRAST TECHNIQUE: Multiplanar multisequence MR imaging was performed without the administration of intravenous contrast. COMPARISON:  CT 05/18/2023  and older FINDINGS: Lower chest: Development of small bilateral pleural effusions. Heart is slightly enlarged. Breathing motion. Hepatobiliary: There is some subtle liver lesions identified as depicted on diffusion. Example segment 7 of the liver posterolateral to the IVC and posterior to the branches of the portal and hepatic veins has a correlate on series 16, image 38 measuring 11 by 10 mm. On diffusion, there are up to 6 lesion suggested. The other lesions are smaller in segment 4, 5, 7 and 2. With the patient's history these to be concerning for potential metastatic lesions. There is some underlying fatty liver infiltration on out of phase imaging of the liver. No biliary ductal dilatation. Multiple stones in the nondilated gallbladder. Pancreas: Global pancreatic atrophy. No obvious mass on diffusion or precontrast T1. Spleen:  Within normal limits in size and appearance. Adrenals/Urinary Tract: No masses identified. No evidence of hydronephrosis. Stomach/Bowel: Surgical changes of previous right hemicolectomy. The visualized portions of the small and large bowel and stomach are nondilated. Vascular/Lymphatic: No pathologically enlarged lymph nodes identified. No abdominal aortic aneurysm demonstrated. Other:  Anasarca.  No ascites. Musculoskeletal: Curvature and degenerative changes of the spine. IMPRESSION: Up to 6 small lesion seen in the liver, best depicted on diffusion. Largest is seen in segment 7. These are incompletely evaluated without contrast but with the patient's history in the overall appearance worrisome for potential metastatic lesions. A dynamic postcontrast dataset may be useful to assess for arterial enhancement when clinically appropriate to further delineate. Small pleural effusions.  Anasarca. Electronically Signed   By: Karen Kays M.D.   On: 05/22/2023 11:24   DG FL GUIDED LUMBAR PUNCTURE  Result Date: 05/21/2023 CLINICAL DATA:  77 year old with encephalopathy, headache, fevers.  Lumbar puncture requested. EXAM: LUMBAR PUNCTURE UNDER FLUOROSCOPY PROCEDURE: An appropriate skin entry site was determined fluoroscopically. Operator donned sterile gloves and mask. Skin site was marked, then prepped with Betadine, draped in usual sterile fashion, and infiltrated locally with 1% lidocaine. Procedure was attempted with a 20 gauge spinal needle, however unsuccessful in accessing thecal sac due to patient's inability to tolerate the procedure due to pain and nausea. The procedure was terminated and needle was then removed. FLUOROSCOPY: Radiation Exposure Index (as provided by the fluoroscopic device): 12.4 mGy Kerma IMPRESSION: Unsuccessful lumbar puncture attempt under fluoroscopy. This exam was performed by Loyce Dys PA-C, and was supervised and interpreted by Carey Bullocks, MD. Electronically Signed   By: Carey Bullocks M.D.   On: 05/21/2023 11:35   MR BRAIN WO CONTRAST  Result Date: 05/19/2023 CLINICAL DATA:  Initial evaluation for neuro deficit, stroke suspected, headache, fever. EXAM: MRI HEAD WITHOUT CONTRAST TECHNIQUE: Multiplanar, multiecho pulse sequences of the brain and surrounding structures were obtained without intravenous contrast. COMPARISON:  CT from 05/17/2023. FINDINGS: Brain: Examination technically limited as the patient was unable to tolerate the full length of the study. Additionally, provided images are degraded by motion. Cerebral volume within normal limits. Patchy T2/FLAIR hyperintensity involving the periventricular deep white matter of both cerebral hemispheres, consistent with chronic small vessel ischemic disease, mild for age. No evidence for acute or subacute ischemia. Gray-white matter differentiation maintained. Or sub chronic cortical infarction. No acute or chronic intracranial blood  products. No mass lesion, midline shift or mass effect. No hydrocephalus or extra-axial fluid collection. Pituitary gland and suprasellar region within normal limits.  Vascular: Major intracranial vascular flow voids are maintained. Skull and upper cervical spine: Craniocervical junction within normal limits. Bone marrow signal intensity normal. No scalp soft tissue abnormality. Sinuses/Orbits: Globes orbital soft tissues within normal limits. Mild scattered mucosal thickening present throughout the paranasal sinuses. No mastoid effusion. Other: None. IMPRESSION: 1. No acute intracranial abnormality. 2. Mild chronic microvascular ischemic disease for age. Electronically Signed   By: Rise Mu M.D.   On: 05/19/2023 21:56   CT CHEST ABDOMEN PELVIS W CONTRAST  Result Date: 05/18/2023 CLINICAL DATA:  Vomiting. Concern for bowel obstruction. History of neuroendocrine tumor. EXAM: CT CHEST, ABDOMEN, AND PELVIS WITH CONTRAST TECHNIQUE: Multidetector CT imaging of the chest, abdomen and pelvis was performed following the standard protocol during bolus administration of intravenous contrast. RADIATION DOSE REDUCTION: This exam was performed according to the departmental dose-optimization program which includes automated exposure control, adjustment of the mA and/or kV according to patient size and/or use of iterative reconstruction technique. CONTRAST:  OMNIPAQUE IOHEXOL 300 MG/ML  SOLN COMPARISON:  Chest radiograph dated 05/18/2023. FINDINGS: CT CHEST FINDINGS Cardiovascular: There is no cardiomegaly or pericardial effusion. The thoracic aorta is unremarkable. The central pulmonary arteries appear patent. Mediastinum/Nodes: There is no hilar or mediastinal adenopathy. There is a small hiatal hernia. The esophagus is grossly unremarkable. There is a 2.4 cm nodule inferior to the left thyroid lobe. Further evaluation with ultrasound recommended. No mediastinal fluid collection. Lungs/Pleura: Minimal bibasilar dependent atelectasis. No focal consolidation, pleural effusion, or pneumothorax. The central airways are patent. Musculoskeletal: No acute osseous pathology. CT  ABDOMEN PELVIS FINDINGS No intra-abdominal free air or free fluid. Hepatobiliary: Fatty liver. No biliary ductal dilatation. Multiple stones in the gallbladder. No pericholecystic fluid or evidence of acute cholecystitis by CT. Ultrasound may provide better evaluation of the gallbladder if there is high clinical concern for acute cholecystitis. Pancreas: The pancreas is unremarkable. Spleen: Normal in size without focal abnormality. Adrenals/Urinary Tract: The adrenal glands unremarkable. There is no hydronephrosis on either side. There is symmetric enhancement and excretion of contrast by both kidneys. The visualized ureters and urinary bladder appear unremarkable. Stomach/Bowel: Small hiatal hernia. There is postsurgical changes of the bowel with anastomotic suture in the right upper abdomen. There is no bowel obstruction or active inflammation. There is loose stool in the colon. Somewhat of formed stool noted in the distal colon. Appendectomy. Vascular/Lymphatic: Mild aortoiliac atherosclerotic disease. The IVC is unremarkable. No portal venous gas. There is no adenopathy. Reproductive: The uterus is anteverted.  No adnexal masses. Other: Midline vertical anterior abdominal wall incisional scar. There is diastasis of anterior abdominal wall musculature with a broad based shallow hernia. Musculoskeletal: Degenerative changes of the spine. No acute osseous pathology. IMPRESSION: 1. No acute intrathoracic, abdominal, or pelvic pathology. 2. No bowel obstruction. 3. Fatty liver. 4. Cholelithiasis. 5. A 2.4 cm nodule inferior to the left thyroid lobe. Recommend thyroid US (ref: J Am Coll Radiol. 2015 Feb;12(2): 143-50). 6.  Aortic Atherosclerosis (ICD10-I70.0). Electronically Signed   By: Elgie Collard M.D.   On: 05/18/2023 22:11   CT Head Wo Contrast  Result Date: 05/18/2023 CLINICAL DATA:  Mental status change, unknown cause h/o neuroendocrine tumor EXAM: CT HEAD WITHOUT CONTRAST TECHNIQUE: Contiguous axial  images were obtained from the base of the skull through the vertex without intravenous contrast. RADIATION DOSE REDUCTION: This exam was performed according to  the departmental dose-optimization program which includes automated exposure control, adjustment of the mA and/or kV according to patient size and/or use of iterative reconstruction technique. COMPARISON:  None Available. FINDINGS: Brain: No evidence of large-territorial acute infarction. No parenchymal hemorrhage. No mass lesion. No extra-axial collection. No mass effect or midline shift. No hydrocephalus. Basilar cisterns are patent. Vascular: No hyperdense vessel. Skull: No acute fracture or focal lesion. Sinuses/Orbits: Paranasal sinuses and mastoid air cells are clear. The orbits are unremarkable. Other: None. IMPRESSION: No acute intracranial abnormality. Electronically Signed   By: Tish Frederickson M.D.   On: 05/18/2023 22:03   DG Chest Port 1 View  Result Date: 05/18/2023 CLINICAL DATA:  Fever EXAM: PORTABLE CHEST 1 VIEW COMPARISON:  None Available. FINDINGS: Heart is mildly enlarged. Mediastinal contours within normal limits. No confluent airspace opacities or effusions. No acute bony abnormality. IMPRESSION: Mild cardiomegaly.  No active disease. Electronically Signed   By: Charlett Nose M.D.   On: 05/18/2023 21:32     Subjective: No acute issues/events   Discharge Exam: Vitals:   05/24/23 0346 05/24/23 0731  BP: (!) 153/59 (!) 140/72  Pulse: (!) 52 (!) 59  Resp: 18 18  Temp: (!) 97.5 F (36.4 C) 98.6 F (37 C)  SpO2: 94% 95%   Vitals:   05/23/23 2017 05/23/23 2354 05/24/23 0346 05/24/23 0731  BP: (!) 140/61 (!) 160/63 (!) 153/59 (!) 140/72  Pulse: (!) 53 (!) 57 (!) 52 (!) 59  Resp: 18 19 18 18   Temp: 98.7 F (37.1 C) 98.4 F (36.9 C) (!) 97.5 F (36.4 C) 98.6 F (37 C)  TempSrc: Oral Oral Oral Oral  SpO2: 97% 95% 94% 95%  Weight:      Height:        General: Pt is alert, awake, not in acute  distress Cardiovascular: RRR, S1/S2 +, no rubs, no gallops Respiratory: CTA bilaterally, no wheezing, no rhonchi Abdominal: Soft, NT, ND, bowel sounds + Extremities: no edema, no cyanosis    The results of significant diagnostics from this hospitalization (including imaging, microbiology, ancillary and laboratory) are listed below for reference.     Microbiology: Recent Results (from the past 240 hour(s))  Resp panel by RT-PCR (RSV, Flu A&B, Covid) Anterior Nasal Swab     Status: None   Collection Time: 05/17/23  3:22 PM   Specimen: Anterior Nasal Swab  Result Value Ref Range Status   SARS Coronavirus 2 by RT PCR NEGATIVE NEGATIVE Final    Comment: (NOTE) SARS-CoV-2 target nucleic acids are NOT DETECTED.  The SARS-CoV-2 RNA is generally detectable in upper respiratory specimens during the acute phase of infection. The lowest concentration of SARS-CoV-2 viral copies this assay can detect is 138 copies/mL. A negative result does not preclude SARS-Cov-2 infection and should not be used as the sole basis for treatment or other patient management decisions. A negative result may occur with  improper specimen collection/handling, submission of specimen other than nasopharyngeal swab, presence of viral mutation(s) within the areas targeted by this assay, and inadequate number of viral copies(<138 copies/mL). A negative result must be combined with clinical observations, patient history, and epidemiological information. The expected result is Negative.  Fact Sheet for Patients:  BloggerCourse.com  Fact Sheet for Healthcare Providers:  SeriousBroker.it  This test is no t yet approved or cleared by the [place] FDA and  has been authorized for detection and/or diagnosis of SARS-CoV-2 by FDA under an Emergency Use Authorization (EUA). This EUA will remain  in effect (  meaning this test can be used) for the duration of the COVID-19  declaration under Section 564(b)(1) of the Act, 21 U.S.C.section 360bbb-3(b)(1), unless the authorization is terminated  or revoked sooner.       Influenza A by PCR NEGATIVE NEGATIVE Final   Influenza B by PCR NEGATIVE NEGATIVE Final    Comment: (NOTE) The Xpert Xpress SARS-CoV-2/FLU/RSV plus assay is intended as an aid in the diagnosis of influenza from Nasopharyngeal swab specimens and should not be used as a sole basis for treatment. Nasal washings and aspirates are unacceptable for Xpert Xpress SARS-CoV-2/FLU/RSV testing.  Fact Sheet for Patients: BloggerCourse.com  Fact Sheet for Healthcare Providers: SeriousBroker.it  This test is not yet approved or cleared by the [place] FDA and has been authorized for detection and/or diagnosis of SARS-CoV-2 by FDA under an Emergency Use Authorization (EUA). This EUA will remain in effect (meaning this test can be used) for the duration of the COVID-19 declaration under Section 564(b)(1) of the Act, 21 U.S.C. section 360bbb-3(b)(1), unless the authorization is terminated or revoked.     Resp Syncytial Virus by PCR NEGATIVE NEGATIVE Final    Comment: (NOTE) Fact Sheet for Patients: BloggerCourse.com  Fact Sheet for Healthcare Providers: SeriousBroker.it  This test is not yet approved or cleared by the [place] FDA and has been authorized for detection and/or diagnosis of SARS-CoV-2 by FDA under an Emergency Use Authorization (EUA). This EUA will remain in effect (meaning this test can be used) for the duration of the COVID-19 declaration under Section 564(b)(1) of the Act, 21 U.S.C. section 360bbb-3(b)(1), unless the authorization is terminated or revoked.  Performed at [facility], [address], [place], [place] [address]   Blood culture (routine x 2)     Status: None   Collection Time: 05/18/23   8:34 PM   Specimen: BLOOD  Result Value Ref Range Status   Specimen Description   Final    BLOOD RIGHT ANTECUBITAL Performed at [facility], [address], [place], [place] [address]    Special Requests   Final    BOTTLES DRAWN AEROBIC AND ANAEROBIC Blood Culture adequate volume Performed at [facility], [address] [place], [place] [address]    Culture   Final    NO GROWTH 5 DAYS Performed at [facility], [address], [place], [place] [address]    Report Status 05/23/2023 FINAL  Final  Blood culture (routine x 2)     Status: None   Collection Time: 05/18/23  9:18 PM   Specimen: BLOOD  Result Value Ref Range Status   Specimen Description   Final    BLOOD BLOOD RIGHT HAND Performed at [facility], [address] [place], [place] [address]    Special Requests   Final    BOTTLES DRAWN AEROBIC AND ANAEROBIC Blood Culture adequate volume Performed at [facility], [address], [place], [place] [address]    Culture   Final    NO GROWTH 5 DAYS Performed at [facility], [address] [place], [place] [address]    Report Status 05/24/2023 FINAL  Final  Resp panel by RT-PCR (RSV, Flu A&B, Covid) Anterior Nasal Swab     Status: None   Collection Time: 05/18/23 10:17 PM   Specimen: Anterior Nasal Swab  Result Value Ref Range Status   SARS Coronavirus 2 by RT PCR NEGATIVE NEGATIVE Final    Comment: (NOTE) SARS-CoV-2 target nucleic acids are NOT  DETECTED.  The SARS-CoV-2 RNA is generally detectable in upper respiratory specimens during the acute phase of infection. The lowest concentration of SARS-CoV-2 viral copies this assay can detect is 138 copies/mL. A negative result does not preclude SARS-Cov-2 infection and should not be used as the sole basis for treatment or other patient management decisions. A negative result may occur with  improper specimen  collection/handling, submission of specimen other than nasopharyngeal swab, presence of viral mutation(s) within the areas targeted by this assay, and inadequate number of viral copies(<138 copies/mL). A negative result must be combined with clinical observations, patient history, and epidemiological information. The expected result is Negative.  Fact Sheet for Patients:  BloggerCourse.com  Fact Sheet for Healthcare Providers:  SeriousBroker.it  This test is no t yet approved or cleared by the [place] FDA and  has been authorized for detection and/or diagnosis of SARS-CoV-2 by FDA under an Emergency Use Authorization (EUA). This EUA will remain  in effect (meaning this test can be used) for the duration of the COVID-19 declaration under Section 564(b)(1) of the Act, 21 U.S.C.section 360bbb-3(b)(1), unless the authorization is terminated  or revoked sooner.       Influenza A by PCR NEGATIVE NEGATIVE Final   Influenza B by PCR NEGATIVE NEGATIVE Final    Comment: (NOTE) The Xpert Xpress SARS-CoV-2/FLU/RSV plus assay is intended as an aid in the diagnosis of influenza from Nasopharyngeal swab specimens and should not be used as a sole basis for treatment. Nasal washings and aspirates are unacceptable for Xpert Xpress SARS-CoV-2/FLU/RSV testing.  Fact Sheet for Patients: BloggerCourse.com  Fact Sheet for Healthcare Providers: SeriousBroker.it  This test is not yet approved or cleared by the [place] FDA and has been authorized for detection and/or diagnosis of SARS-CoV-2 by FDA under an Emergency Use Authorization (EUA). This EUA will remain in effect (meaning this test can be used) for the duration of the COVID-19 declaration under Section 564(b)(1) of the Act, 21 U.S.C. section 360bbb-3(b)(1), unless the authorization is terminated or revoked.     Resp Syncytial  Virus by PCR NEGATIVE NEGATIVE Final    Comment: (NOTE) Fact Sheet for Patients: BloggerCourse.com  Fact Sheet for Healthcare Providers: SeriousBroker.it  This test is not yet approved or cleared by the [place] FDA and has been authorized for detection and/or diagnosis of SARS-CoV-2 by FDA under an Emergency Use Authorization (EUA). This EUA will remain in effect (meaning this test can be used) for the duration of the COVID-19 declaration under Section 564(b)(1) of the Act, 21 U.S.C. section 360bbb-3(b)(1), unless the authorization is terminated or revoked.  Performed at [facility], [address] [place], [place] [address]   Urine Culture     Status: Abnormal   Collection Time: 05/18/23 11:09 PM   Specimen: Urine, Random  Result Value Ref Range Status   Specimen Description   Final    URINE, RANDOM Performed at [facility], [address], [place], [place] [address]    Special Requests   Final    NONE Reflexed from [id number] Performed at [facility], [address] [place], [place] [address]    Culture (A)  Final    <10,000 COLONIES/mL INSIGNIFICANT GROWTH Performed at [facility], [address] [place], [place] [address]    Report Status 05/20/2023 FINAL  Final  Respiratory (~20 pathogens) panel by PCR     Status: None   Collection Time: 05/19/23  6:23 PM   Specimen: Nasopharyngeal Swab;  Respiratory  Result Value Ref Range Status   Adenovirus NOT DETECTED NOT DETECTED Final   Coronavirus 229E NOT DETECTED NOT DETECTED Final    Comment: (NOTE) The Coronavirus on the Respiratory Panel, DOES NOT test for the novel  Coronavirus (2019 nCoV)    Coronavirus HKU1 NOT DETECTED NOT DETECTED Final   Coronavirus NL63 NOT DETECTED NOT DETECTED Final   Coronavirus OC43 NOT DETECTED NOT DETECTED Final   Metapneumovirus NOT DETECTED NOT DETECTED Final    Rhinovirus / Enterovirus NOT DETECTED NOT DETECTED Final   Influenza A NOT DETECTED NOT DETECTED Final   Influenza B NOT DETECTED NOT DETECTED Final   Parainfluenza Virus 1 NOT DETECTED NOT DETECTED Final   Parainfluenza Virus 2 NOT DETECTED NOT DETECTED Final   Parainfluenza Virus 3 NOT DETECTED NOT DETECTED Final   Parainfluenza Virus 4 NOT DETECTED NOT DETECTED Final   Respiratory Syncytial Virus NOT DETECTED NOT DETECTED Final   Bordetella pertussis NOT DETECTED NOT DETECTED Final   Bordetella Parapertussis NOT DETECTED NOT DETECTED Final   Chlamydophila pneumoniae NOT DETECTED NOT DETECTED Final   Mycoplasma pneumoniae NOT DETECTED NOT DETECTED Final    Comment: Performed at [facility], [address], [place], [place] [address]  CSF culture w Gram Stain     Status: None (Preliminary result)   Collection Time: 05/22/23  4:21 PM   Specimen: CSF; Cerebrospinal Fluid  Result Value Ref Range Status   Specimen Description CSF  Final   Special Requests NONE  Final   Gram Stain   Final    WBC PRESENT, PREDOMINANTLY MONONUCLEAR NO ORGANISMS SEEN CYTOSPIN SMEAR    Culture   Final    NO GROWTH 2 DAYS Performed at [facility], [address], [place], [place] [address]    Report Status PENDING  Incomplete  Culture, fungus without smear     Status: None (Preliminary result)   Collection Time: 05/22/23  4:21 PM   Specimen: CSF; Cerebrospinal Fluid  Result Value Ref Range Status   Specimen Description CSF  Final   Special Requests NONE  Final   Culture   Final    NO FUNGUS ISOLATED AFTER 2 DAYS Performed at [facility], [address], [place], [place] [address]    Report Status PENDING  Incomplete  Anaerobic culture w Gram Stain     Status: None (Preliminary result)   Collection Time: 05/22/23  4:21 PM   Specimen: CSF; Cerebrospinal Fluid  Result Value Ref Range Status   Specimen Description CSF  Final   Special Requests NONE  Final   Gram Stain   Final     WBC PRESENT, PREDOMINANTLY MONONUCLEAR NO ORGANISMS SEEN Performed at [facility], [address], [place], [place] [address]    Culture   Final    NO ANAEROBES ISOLATED; CULTURE IN PROGRESS FOR 5 DAYS   Report Status PENDING  Incomplete     Labs: BNP (last 3 results) No results for input(s): "BNP" in the last 8760 hours. Basic Metabolic Panel: Recent Labs  Lab 05/18/23 2033 05/19/23 1013 05/19/23 1759 05/20/23 0355 05/21/23 0833 05/22/23 0820  NA 138  --  137 136 135 142  K 3.7  --  3.8 3.9 2.8* 3.4*  CL 103  --  102 99 101 102  CO2 25  --  25 24 27 30   GLUCOSE 158*  --  108* 107* 134* 107*  BUN 21  --  18 16 10  8  CREATININE 1.16* 0.69 1.04* 1.05* 0.91 0.88  CALCIUM 9.0  --  8.1* 8.1* 8.1* 8.4*   Liver Function Tests: Recent Labs  Lab 05/18/23 2033 05/19/23 1759 05/20/23 0355  AST 20 33 37  ALT 16 26 26   ALKPHOS 53 46 48  BILITOT 0.9 0.6 1.0  PROT 7.0 5.4* 5.5*  ALBUMIN 4.5 2.8* 3.0*   No results for input(s): "LIPASE", "AMYLASE" in the last 168 hours.  Recent Labs  Lab 05/19/23 0149  AMMONIA 15   CBC: Recent Labs  Lab 05/18/23 2033 05/19/23 1759 05/20/23 0355 05/23/23 1046  WBC 9.2 8.5 8.6 5.5  NEUTROABS 7.1  --   --   --   HGB 13.0 12.0 11.5* 12.5  HCT 37.7 35.0* 34.3* 35.6*  MCV 96.9 99.7 98.6 97.0  PLT 135* 90* 103* 123*   Cardiac Enzymes: Recent Labs  Lab 05/18/23 2033  CKTOTAL 220   BNP: Invalid input(s): "POCBNP" CBG: No results for input(s): "GLUCAP" in the last 168 hours. D-Dimer No results for input(s): "DDIMER" in the last 72 hours. Hgb A1c No results for input(s): "HGBA1C" in the last 72 hours. Lipid Profile No results for input(s): "CHOL", "HDL", "LDLCALC", "TRIG", "CHOLHDL", "LDLDIRECT" in the last 72 hours. Thyroid function studies No results for input(s): "TSH", "T4TOTAL", "T3FREE", "THYROIDAB" in the last 72 hours.  Invalid input(s): "FREET3" Anemia work up No results for input(s): "VITAMINB12", "FOLATE",  "FERRITIN", "TIBC", "IRON", "RETICCTPCT" in the last 72 hours. Urinalysis    Component Value Date/Time   COLORURINE YELLOW 05/18/2023 2309   APPEARANCEUR CLEAR 05/18/2023 2309   LABSPEC >1.046 (H) 05/18/2023 2309   PHURINE 6.0 05/18/2023 2309   GLUCOSEU NEGATIVE 05/18/2023 2309   HGBUR NEGATIVE 05/18/2023 2309   BILIRUBINUR NEGATIVE 05/18/2023 2309   BILIRUBINUR n 05/02/2011 1025   KETONESUR NEGATIVE 05/18/2023 2309   PROTEINUR TRACE (A) 05/18/2023 2309   UROBILINOGEN 0.2 05/02/2011 1025   NITRITE NEGATIVE 05/18/2023 2309   LEUKOCYTESUR SMALL (A) 05/18/2023 2309   Sepsis Labs Recent Labs  Lab 05/18/23 2033 05/19/23 1759 05/20/23 0355 05/23/23 1046  WBC 9.2 8.5 8.6 5.5   Microbiology Recent Results (from the past 240 hour(s))  Resp panel by RT-PCR (RSV, Flu A&B, Covid) Anterior Nasal Swab     Status: None   Collection Time: 05/17/23  3:22 PM   Specimen: Anterior Nasal Swab  Result Value Ref Range Status   SARS Coronavirus 2 by RT PCR NEGATIVE NEGATIVE Final    Comment: (NOTE) SARS-CoV-2 target nucleic acids are NOT DETECTED.  The SARS-CoV-2 RNA is generally detectable in upper respiratory specimens during the acute phase of infection. The lowest concentration of SARS-CoV-2 viral copies this assay can detect is 138 copies/mL. A negative result does not preclude SARS-Cov-2 infection and should not be used as the sole basis for treatment or other patient management decisions. A negative result may occur with  improper specimen collection/handling, submission of specimen other than nasopharyngeal swab, presence of viral mutation(s) within the areas targeted by this assay, and inadequate number of viral copies(<138 copies/mL). A negative result must be combined with clinical observations, patient history, and epidemiological information. The expected result is Negative.  Fact Sheet for Patients:  BloggerCourse.com  Fact Sheet for Healthcare  Providers:  SeriousBroker.it  This test is no t yet approved or cleared by the [place] FDA and  has been authorized for detection and/or diagnosis of SARS-CoV-2 by FDA under an Emergency Use Authorization (EUA). This EUA will remain  in effect (  meaning this test can be used) for the duration of the COVID-19 declaration under Section 564(b)(1) of the Act, 21 U.S.C.section 360bbb-3(b)(1), unless the authorization is terminated  or revoked sooner.       Influenza A by PCR NEGATIVE NEGATIVE Final   Influenza B by PCR NEGATIVE NEGATIVE Final    Comment: (NOTE) The Xpert Xpress SARS-CoV-2/FLU/RSV plus assay is intended as an aid in the diagnosis of influenza from Nasopharyngeal swab specimens and should not be used as a sole basis for treatment. Nasal washings and aspirates are unacceptable for Xpert Xpress SARS-CoV-2/FLU/RSV testing.  Fact Sheet for Patients: BloggerCourse.com  Fact Sheet for Healthcare Providers: SeriousBroker.it  This test is not yet approved or cleared by the [place] FDA and has been authorized for detection and/or diagnosis of SARS-CoV-2 by FDA under an Emergency Use Authorization (EUA). This EUA will remain in effect (meaning this test can be used) for the duration of the COVID-19 declaration under Section 564(b)(1) of the Act, 21 U.S.C. section 360bbb-3(b)(1), unless the authorization is terminated or revoked.     Resp Syncytial Virus by PCR NEGATIVE NEGATIVE Final    Comment: (NOTE) Fact Sheet for Patients: BloggerCourse.com  Fact Sheet for Healthcare Providers: SeriousBroker.it  This test is not yet approved or cleared by the [place] FDA and has been authorized for detection and/or diagnosis of SARS-CoV-2 by FDA under an Emergency Use Authorization (EUA). This EUA will remain in effect (meaning this test can be  used) for the duration of the COVID-19 declaration under Section 564(b)(1) of the Act, 21 U.S.C. section 360bbb-3(b)(1), unless the authorization is terminated or revoked.  Performed at [facility], [address], [place], [place] [address]   Blood culture (routine x 2)     Status: None   Collection Time: 05/18/23  8:34 PM   Specimen: BLOOD  Result Value Ref Range Status   Specimen Description   Final    BLOOD RIGHT ANTECUBITAL Performed at [facility], [address], [place], [place] [address]    Special Requests   Final    BOTTLES DRAWN AEROBIC AND ANAEROBIC Blood Culture adequate volume Performed at [facility], [address], [place], [place] [address]    Culture   Final    NO GROWTH 5 DAYS Performed at [facility], [address], [place], [place] [address]    Report Status 05/23/2023 FINAL  Final  Blood culture (routine x 2)     Status: None   Collection Time: 05/18/23  9:18 PM   Specimen: BLOOD  Result Value Ref Range Status   Specimen Description   Final    BLOOD BLOOD RIGHT HAND Performed at [facility], [address], [place], [place] [address]    Special Requests   Final    BOTTLES DRAWN AEROBIC AND ANAEROBIC Blood Culture adequate volume Performed at [facility], [address] [place], [place] [address]    Culture   Final    NO GROWTH 5 DAYS Performed at [facility], [address], [place], [place] [address]    Report Status 05/24/2023 FINAL  Final  Resp panel by RT-PCR (RSV, Flu A&B, Covid) Anterior Nasal Swab     Status: None   Collection Time: 05/18/23 10:17 PM   Specimen: Anterior Nasal Swab  Result Value Ref Range Status   SARS Coronavirus 2 by RT PCR NEGATIVE NEGATIVE Final    Comment: (NOTE) SARS-CoV-2 target nucleic acids are NOT DETECTED.  The SARS-CoV-2 RNA is generally detectable in upper respiratory specimens during  the acute phase of infection. The lowest concentration of SARS-CoV-2 viral copies this assay can detect is 138 copies/mL. A negative result does not preclude SARS-Cov-2 infection and should not be used as the sole basis for treatment or other patient management decisions. A negative result may occur with  improper specimen collection/handling, submission of specimen other than nasopharyngeal swab, presence of viral mutation(s) within the areas targeted by this assay, and inadequate number of viral copies(<138 copies/mL). A negative result must be combined with clinical observations, patient history, and epidemiological information. The expected result is Negative.  Fact Sheet for Patients:  BloggerCourse.com  Fact Sheet for Healthcare Providers:  SeriousBroker.it  This test is no t yet approved or cleared by the [place] FDA and  has been authorized for detection and/or diagnosis of SARS-CoV-2 by FDA under an Emergency Use Authorization (EUA). This EUA will remain  in effect (meaning this test can be used) for the duration of the COVID-19 declaration under Section 564(b)(1) of the Act, 21 U.S.C.section 360bbb-3(b)(1), unless the authorization is terminated  or revoked sooner.       Influenza A by PCR NEGATIVE NEGATIVE Final   Influenza B by PCR NEGATIVE NEGATIVE Final    Comment: (NOTE) The Xpert Xpress SARS-CoV-2/FLU/RSV plus assay is intended as an aid in the diagnosis of influenza from Nasopharyngeal swab specimens and should not be used as a sole basis for treatment. Nasal washings and aspirates are unacceptable for Xpert Xpress SARS-CoV-2/FLU/RSV testing.  Fact Sheet for Patients: BloggerCourse.com  Fact Sheet for Healthcare Providers: SeriousBroker.it  This test is not yet approved or cleared by the [place] FDA and has been authorized for detection and/or  diagnosis of SARS-CoV-2 by FDA under an Emergency Use Authorization (EUA). This EUA will remain in effect (meaning this test can be used) for the duration of the COVID-19 declaration under Section 564(b)(1) of the Act, 21 U.S.C. section 360bbb-3(b)(1), unless the authorization is terminated or revoked.     Resp Syncytial Virus by PCR NEGATIVE NEGATIVE Final    Comment: (NOTE) Fact Sheet for Patients: BloggerCourse.com  Fact Sheet for Healthcare Providers: SeriousBroker.it  This test is not yet approved or cleared by the [place] FDA and has been authorized for detection and/or diagnosis of SARS-CoV-2 by FDA under an Emergency Use Authorization (EUA). This EUA will remain in effect (meaning this test can be used) for the duration of the COVID-19 declaration under Section 564(b)(1) of the Act, 21 U.S.C. section 360bbb-3(b)(1), unless the authorization is terminated or revoked.  Performed at [facility], [address], [place], [place] [address]   Urine Culture     Status: Abnormal   Collection Time: 05/18/23 11:09 PM   Specimen: Urine, Random  Result Value Ref Range Status   Specimen Description   Final    URINE, RANDOM Performed at [facility], [address] [place], [place] [address]    Special Requests   Final    NONE Reflexed from [id number] Performed at [facility], [address], [place], [place] [address]    Culture (A)  Final    <10,000 COLONIES/mL INSIGNIFICANT GROWTH Performed at [facility], [address] [place], [place] [address]    Report Status 05/20/2023 FINAL  Final  Respiratory (~20 pathogens) panel by PCR     Status: None   Collection Time: 05/19/23  6:23 PM   Specimen: Nasopharyngeal Swab; Respiratory  Result Value Ref Range Status   Adenovirus NOT DETECTED NOT DETECTED Final   Coronavirus 229E NOT DETECTED NOT DETECTED Final     Comment: (NOTE) The Coronavirus on the Respiratory Panel, DOES NOT test for the novel  Coronavirus (2019 nCoV)    Coronavirus HKU1 NOT DETECTED NOT DETECTED Final   Coronavirus NL63 NOT DETECTED NOT DETECTED Final   Coronavirus OC43 NOT DETECTED NOT DETECTED Final   Metapneumovirus NOT DETECTED NOT DETECTED Final   Rhinovirus / Enterovirus NOT DETECTED NOT DETECTED Final   Influenza A NOT DETECTED NOT DETECTED Final   Influenza B NOT DETECTED NOT DETECTED Final   Parainfluenza Virus 1 NOT DETECTED NOT DETECTED Final   Parainfluenza Virus 2 NOT DETECTED NOT DETECTED Final   Parainfluenza Virus 3 NOT DETECTED NOT DETECTED Final   Parainfluenza Virus 4 NOT DETECTED NOT DETECTED Final   Respiratory Syncytial Virus NOT DETECTED NOT DETECTED Final   Bordetella pertussis NOT DETECTED NOT DETECTED Final   Bordetella Parapertussis NOT DETECTED NOT DETECTED Final   Chlamydophila pneumoniae NOT DETECTED NOT DETECTED Final   Mycoplasma pneumoniae NOT DETECTED NOT DETECTED Final    Comment: Performed at [facility], [address] [place], [place] [address]  CSF culture w Gram Stain     Status: None (Preliminary result)   Collection Time: 05/22/23  4:21 PM   Specimen: CSF; Cerebrospinal Fluid  Result Value Ref Range Status   Specimen Description CSF  Final   Special Requests NONE  Final   Gram Stain   Final    WBC PRESENT, PREDOMINANTLY MONONUCLEAR NO ORGANISMS SEEN CYTOSPIN SMEAR    Culture   Final    NO GROWTH 2 DAYS Performed at [facility], [address] [place], [place] [address]    Report Status PENDING  Incomplete  Culture, fungus without smear     Status: None (Preliminary result)   Collection Time: 05/22/23  4:21 PM   Specimen: CSF; Cerebrospinal Fluid  Result Value Ref Range Status   Specimen Description CSF  Final   Special Requests NONE  Final   Culture   Final    NO FUNGUS ISOLATED AFTER 2 DAYS Performed at [facility], [address]  [place], [place] [address]    Report Status PENDING  Incomplete  Anaerobic culture w Gram Stain     Status: None (Preliminary result)   Collection Time: 05/22/23  4:21 PM   Specimen: CSF; Cerebrospinal Fluid  Result Value Ref Range Status   Specimen Description CSF  Final   Special Requests NONE  Final   Gram Stain   Final    WBC PRESENT, PREDOMINANTLY MONONUCLEAR NO ORGANISMS SEEN Performed at [facility], [address], [place], [place] [address]    Culture   Final    NO ANAEROBES ISOLATED; CULTURE IN PROGRESS FOR 5 DAYS   Report Status PENDING  Incomplete     Time coordinating discharge: Over 30 minutes  SIGNED:   Azucena Fallen, DO [facility] 05/24/2023, 6:50 PM Pager   If 7PM-7AM, please contact night-coverage [email or web address]

## 2023-05-24 NOTE — Plan of Care (Signed)
  Problem: Education: Goal: Knowledge of General Education information will improve Description Including pain rating scale, medication(s)/side effects and non-pharmacologic comfort measures Outcome: Progressing   

## 2023-05-24 NOTE — Progress Notes (Signed)
Discharge instructions given. Patient verbalized understanding and all questions were answered.  ?

## 2023-05-25 ENCOUNTER — Telehealth: Payer: Self-pay | Admitting: Nurse Practitioner

## 2023-05-25 NOTE — Telephone Encounter (Signed)
LVM for patient in reference to upcoming appointment, on 8/1 with arrival time of 815, left my call back number in case she had any questions

## 2023-05-30 NOTE — Progress Notes (Addendum)
[facility]   Telephone:(336) [phone] Fax:(336) [phone]   Clinic Follow up Note   Patient Care Team: Edwyna Perfect, MD (Inactive) as PCP - General (Internal Medicine) 05/31/2023  CHIEF COMPLAINTS/PURPOSE OF CONSULTATION:  Neuroendocrine tumor, previously seen in hospital by on-call physician Dr. Pamelia Hoit  HISTORY OF PRESENTING ILLNESS:  Anna Cortez 77 y.o. female with PMH including CKD, depression, hyperlipidemia, hyperparathyroidism, GERD, HTN, and arthritis is here because of a longstanding history of metastatic carcinoid. Seen by Dr. Pamelia Hoit in the hospital, see his consult note 05/21/23. 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  She underwent octreotide scan that showed avid disease at the ileocecal valve, in the right pelvis, and within some juxta caval adenopathy.  She underwent right hemicolectomy and BSO by Dr. Flonnie Hailstone 12/05/2012. 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 No CT evidence of residual disease.  Thyroid nodules were evaluated and biopsied, benign.  She was followed by surgery.  She was found to have liver metastasis on MRI in 04/2014 showing multiple lesions suspicious for metastatic NET.  Symptoms of flushing and diarrhea were present.  She continued observation and symptom management with Dr. Flonnie Hailstone 2016 - 2017 and eventually referred to med onc due to worsening carcinoid syndrome.  She began first-line octreotide Cortez mg on 05/08/2016.  Her carcinoid symptoms worsened in the end of 2018 while she lost follow up and was off sandostatin from 12/2016 until she started Lutathera on 08/23/17 (subsequent doses 12/Cortez/18, 2/29/19, and 02/14/18 + sandostatin) and continued sandostatin.  She moved to [place] 11/2018 for work and was followed by [facility] where she continued  monthly Sandostatin under Dr. Maureen Chatters through 01/13/2021 when she felt it was no longer helping and stopped it.  CT at that time with stable disease.  She was seen at [facility] for second opinion continued supportive care at the time until her symptoms worsened.  She started lanreotide 08/2021 and was retreated with Lutathera starting 08/2021 - 02/10/22 x 3 doses; dose held due to poor tolerance (fatigue and nausea). Last lanreotide 01/2022.  She relocated back to the area 2 months ago to be near her family. Developed acute n/v and headache and presented to the ED 05/18/2023. Head CT was negative.  CT CAP showed fatty liver, no acute abnormality or bowel obstruction, and a left thyroid nodule.  Follow-up liver MRI 05/22/2023 showed subtle liver lesions, to 6 and number.  Chromogranin A was normal, 68.8.  Seen in the hospital by on-call physician Dr. Pamelia Hoit.  She was also having symptoms of meningitis and underwent a spinal tap showing lymphocytes suggestive of viral meningitis. Discharged 05/24/23.   Socially she lives independently, works as a Civil engineer, contracting.  Drinks alcohol occasionally, never tobacco user, and denies other drug use.  Family history significant for father with prostate cancer in sister 5 years out from DCIS.   Today she presents with her sister.  She was recovering well from hospitalization, headaches nearly resolved until 2-3 days ago she developed right upper and lower abdominal pain, low-grade fevers up to 99.?  and unusual looking urine.  She has not had pain like this before but it is much better today. She has occasional flushing/night sweats, not bad, and diarrhea ~4 x per day or more pending diet which is her baseline. She also endorses some anxiety and sensitivity to caffeine.  She has lost about 10 pounds since moving back from [place].     MEDICAL HISTORY:  Past Medical History:  Diagnosis Date   Degenerative disc disease    Insomnia    Osteoarthritis     SURGICAL  HISTORY: Past Surgical History:  Procedure Laterality Date   KNEE SURGERY     Right Knee   TUBAL LIGATION     WISDOM TOOTH EXTRACTION      SOCIAL HISTORY: Social History   Socioeconomic History   Marital status: Single    Spouse name: Not on file   Number of children: Not on file   Years of education: Not on file   Highest education level: Not on file  Occupational History   Not on file  Tobacco Use   Smoking status: Never   Smokeless tobacco: Never  Substance and Sexual Activity   Alcohol use: Yes    Comment: beer   Drug use: No   Sexual activity: Never  Other Topics Concern   Not on file  Social History Narrative   Not on file   Social Determinants of Health   Financial Resource Strain: Not on file  Food Insecurity: No Food Insecurity (05/22/2023)   Hunger Vital Sign    Worried About Running Out of Food in the Last Year: Never true    Ran Out of Food in the Last Year: Never true  Transportation Needs: No Transportation Needs (05/22/2023)   PRAPARE - Administrator, Civil Service (Medical): No    Lack of Transportation (Non-Medical): No  Physical Activity: Not on file  Stress: Not on file  Social Connections: Not on file  Intimate Partner Violence: Not At Risk (05/22/2023)   Humiliation, Afraid, Rape, and Kick questionnaire    Fear of Current or Ex-Partner: No    Emotionally Abused: No    Physically Abused: No    Sexually Abused: No    FAMILY HISTORY: Family History  Problem Relation Age of Onset   Heart disease Neg Hx    Prostate cancer Father    Colon cancer Neg Hx    Breast cancer Neg Hx    Hypertension Neg Hx    Diabetes Neg Hx    Hyperlipidemia Neg Hx     ALLERGIES:  is allergic to astemizole, penicillins, prochlorperazine edisylate, and tolectin [tolmetin sodium].  MEDICATIONS:  Current Outpatient Medications  Medication Sig Dispense Refill   acetaminophen (TYLENOL) 325 MG tablet Take 2 tablets (650 mg total) by mouth every 6  (six) hours as needed for mild pain (or Fever >/= 101). 30 tablet 0   diphenoxylate-atropine (LOMOTIL) 2.5-0.025 MG tablet Take 1-2 tablets by mouth 4 (four) times daily as needed for diarrhea or loose stools. 40 tablet 1   Telotristat Ethyl,as Etiprate, (XERMELO) 250 MG TABS Take 1 tablet (250 mg total) by mouth 3 (three) times daily. 90 tablet 0   aspirin-acetaminophen-caffeine (EXCEDRIN MIGRAINE) 250-250-65 MG tablet Take 1 tablet by mouth every 6 (six) hours as needed for headache or migraine. (Patient not taking: Reported on 05/31/2023) 30 tablet 0   No current facility-administered medications for this visit.    REVIEW OF SYSTEMS:   Constitutional: Denies chills (+) low grade fever (+) weight loss (+) abnormal night sweats/flushing  Eyes: Denies blurriness of vision, double vision or watery eyes Ears, nose, mouth, throat, and face: Denies mucositis or sore throat Respiratory: Denies cough, dyspnea or wheezes Cardiovascular: Denies palpitation, chest discomfort or lower extremity swelling Gastrointestinal:  Denies nausea, vomiting,  constipation, heartburn or change in bowel habit (+) diarrhea (+) RUQ/RLQ abd pain Skin: Denies abnormal skin rashes Lymphatics: Denies new lymphadenopathy or easy bruising Neurological:Denies numbness, tingling or new weaknesses Behavioral/Psych: Mood is stable, no new changes (+) anxiety  All other systems were reviewed with the patient and are negative.  PHYSICAL EXAMINATION: ECOG PERFORMANCE STATUS: 1 - Symptomatic but completely ambulatory  Vitals:   05/31/23 0824  BP: 135/64  Pulse: 69  Resp: 18  Temp: 97.8 F (36.6 C)  SpO2: 99%   Filed Weights   05/31/23 0824  Weight: 175 lb 11.2 oz (79.7 kg)    GENERAL:alert, no distress and comfortable SKIN:  no rashes or significant lesions EYES: sclera clear NECK: Without mass LYMPH:  no palpable cervical or supraclavicular lymphadenopathy LUNGS: clear with normal breathing effort HEART: regular  rate & rhythm, no lower extremity edema ABDOMEN:abdomen soft, non-tender and normal bowel sounds. No palpable mass Musculoskeletal:no cyanosis of digits and no clubbing  PSYCH: alert & oriented x 3 with fluent speech NEURO: no focal motor/sensory deficits  LABORATORY DATA:  I have reviewed the data as listed    Latest Ref Rng & Units 05/31/2023    9:35 AM 05/23/2023   10:46 AM 05/20/2023    3:55 AM  CBC  WBC 4.0 - 10.5 K/uL 7.5  5.5  8.6   Hemoglobin 12.0 - 15.0 g/dL 82.9  56.2  13.0   Hematocrit 36.0 - 46.0 % 36.7  35.6  34.3   Platelets 150 - 400 K/uL 227  123  103       Latest Ref Rng & Units 05/31/2023    9:35 AM 05/22/2023    8:Cortez AM 05/21/2023    8:33 AM  CMP  Glucose 70 - 99 mg/dL 865  784  696   BUN 8 - 23 mg/dL 15  8  10    Creatinine 0.44 - 1.00 mg/dL 2.95  2.84  1.32   Sodium 135 - 145 mmol/L 140  142  135   Potassium 3.5 - 5.1 mmol/L 4.2  3.4  2.8   Chloride 98 - 111 mmol/L 107  102  101   CO2 22 - 32 mmol/L 24  30  27    Calcium 8.9 - 10.3 mg/dL 9.2  8.4  8.1   Total Protein 6.5 - 8.1 g/dL 7.1     Total Bilirubin 0.3 - 1.2 mg/dL 0.7     Alkaline Phos 38 - 126 U/L 71     AST 15 - 41 U/L 19     ALT 0 - 44 U/L 21         RADIOGRAPHIC STUDIES: I have personally reviewed the radiological images as listed and agreed with the findings in the report. MR ABDOMEN WO CONTRAST  Result Date: 05/22/2023 CLINICAL DATA:  Carcinoid tumor of the appendix. EXAM: MRI ABDOMEN WITHOUT CONTRAST TECHNIQUE: Multiplanar multisequence MR imaging was performed without the administration of intravenous contrast. COMPARISON:  CT 05/18/2023 and older FINDINGS: Lower chest: Development of small bilateral pleural effusions. Heart is slightly enlarged. Breathing motion. Hepatobiliary: There is some subtle liver lesions identified as depicted on diffusion. Example segment 7 of the liver posterolateral to the IVC and posterior to the branches of the portal and hepatic veins has a correlate on series 16,  image 38 measuring 11 by 10 mm. On diffusion, there are up to 6 lesion suggested. The other lesions are smaller in segment 4, 5, 7 and 2. With the patient's history these to be  concerning for potential metastatic lesions. There is some underlying fatty liver infiltration on out of phase imaging of the liver. No biliary ductal dilatation. Multiple stones in the nondilated gallbladder. Pancreas: Global pancreatic atrophy. No obvious mass on diffusion or precontrast T1. Spleen:  Within normal limits in size and appearance. Adrenals/Urinary Tract: No masses identified. No evidence of hydronephrosis. Stomach/Bowel: Surgical changes of previous right hemicolectomy. The visualized portions of the small and large bowel and stomach are nondilated. Vascular/Lymphatic: No pathologically enlarged lymph nodes identified. No abdominal aortic aneurysm demonstrated. Other:  Anasarca.  No ascites. Musculoskeletal: Curvature and degenerative changes of the spine. IMPRESSION: Up to 6 small lesion seen in the liver, best depicted on diffusion. Largest is seen in segment 7. These are incompletely evaluated without contrast but with the patient's history in the overall appearance worrisome for potential metastatic lesions. A dynamic postcontrast dataset may be useful to assess for arterial enhancement when clinically appropriate to further delineate. Small pleural effusions.  Anasarca. Electronically Signed   By: Karen Kays M.D.   On: 05/22/2023 11:24   DG FL GUIDED LUMBAR PUNCTURE  Result Date: 05/21/2023 CLINICAL DATA:  77 year old with encephalopathy, headache, fevers. Lumbar puncture requested. EXAM: LUMBAR PUNCTURE UNDER FLUOROSCOPY PROCEDURE: An appropriate skin entry site was determined fluoroscopically. Operator donned sterile gloves and mask. Skin site was marked, then prepped with Betadine, draped in usual sterile fashion, and infiltrated locally with 1% lidocaine. Procedure was attempted with a Cortez gauge spinal needle,  however unsuccessful in accessing thecal sac due to patient's inability to tolerate the procedure due to pain and nausea. The procedure was terminated and needle was then removed. FLUOROSCOPY: Radiation Exposure Index (as provided by the fluoroscopic device): 12.4 mGy Kerma IMPRESSION: Unsuccessful lumbar puncture attempt under fluoroscopy. This exam was performed by Loyce Dys PA-C, and was supervised and interpreted by Carey Bullocks, MD. Electronically Signed   By: Carey Bullocks M.D.   On: 05/21/2023 11:35   MR BRAIN WO CONTRAST  Result Date: 7/Cortez/2024 CLINICAL DATA:  Initial evaluation for neuro deficit, stroke suspected, headache, fever. EXAM: MRI HEAD WITHOUT CONTRAST TECHNIQUE: Multiplanar, multiecho pulse sequences of the brain and surrounding structures were obtained without intravenous contrast. COMPARISON:  CT from 05/17/2023. FINDINGS: Brain: Examination technically limited as the patient was unable to tolerate the full length of the study. Additionally, provided images are degraded by motion. Cerebral volume within normal limits. Patchy T2/FLAIR hyperintensity involving the periventricular deep white matter of both cerebral hemispheres, consistent with chronic small vessel ischemic disease, mild for age. No evidence for acute or subacute ischemia. Gray-white matter differentiation maintained. Or sub chronic cortical infarction. No acute or chronic intracranial blood products. No mass lesion, midline shift or mass effect. No hydrocephalus or extra-axial fluid collection. Pituitary gland and suprasellar region within normal limits. Vascular: Major intracranial vascular flow voids are maintained. Skull and upper cervical spine: Craniocervical junction within normal limits. Bone marrow signal intensity normal. No scalp soft tissue abnormality. Sinuses/Orbits: Globes orbital soft tissues within normal limits. Mild scattered mucosal thickening present throughout the paranasal sinuses. No mastoid  effusion. Other: None. IMPRESSION: 1. No acute intracranial abnormality. 2. Mild chronic microvascular ischemic disease for age. Electronically Signed   By: Rise Mu M.D.   On: 07/Cortez/2024 21:56   CT CHEST ABDOMEN PELVIS W CONTRAST  Result Date: 05/18/2023 CLINICAL DATA:  Vomiting. Concern for bowel obstruction. History of neuroendocrine tumor. EXAM: CT CHEST, ABDOMEN, AND PELVIS WITH CONTRAST TECHNIQUE: Multidetector CT imaging of the chest, abdomen  and pelvis was performed following the standard protocol during bolus administration of intravenous contrast. RADIATION DOSE REDUCTION: This exam was performed according to the departmental dose-optimization program which includes automated exposure control, adjustment of the mA and/or kV according to patient size and/or use of iterative reconstruction technique. CONTRAST:  OMNIPAQUE IOHEXOL 300 MG/ML  SOLN COMPARISON:  Chest radiograph dated 05/18/2023. FINDINGS: CT CHEST FINDINGS Cardiovascular: There is no cardiomegaly or pericardial effusion. The thoracic aorta is unremarkable. The central pulmonary arteries appear patent. Mediastinum/Nodes: There is no hilar or mediastinal adenopathy. There is a small hiatal hernia. The esophagus is grossly unremarkable. There is a 2.4 cm nodule inferior to the left thyroid lobe. Further evaluation with ultrasound recommended. No mediastinal fluid collection. Lungs/Pleura: Minimal bibasilar dependent atelectasis. No focal consolidation, pleural effusion, or pneumothorax. The central airways are patent. Musculoskeletal: No acute osseous pathology. CT ABDOMEN PELVIS FINDINGS No intra-abdominal free air or free fluid. Hepatobiliary: Fatty liver. No biliary ductal dilatation. Multiple stones in the gallbladder. No pericholecystic fluid or evidence of acute cholecystitis by CT. Ultrasound may provide better evaluation of the gallbladder if there is high clinical concern for acute cholecystitis. Pancreas: The  pancreas is unremarkable. Spleen: Normal in size without focal abnormality. Adrenals/Urinary Tract: The adrenal glands unremarkable. There is no hydronephrosis on either side. There is symmetric enhancement and excretion of contrast by both kidneys. The visualized ureters and urinary bladder appear unremarkable. Stomach/Bowel: Small hiatal hernia. There is postsurgical changes of the bowel with anastomotic suture in the right upper abdomen. There is no bowel obstruction or active inflammation. There is loose stool in the colon. Somewhat of formed stool noted in the distal colon. Appendectomy. Vascular/Lymphatic: Mild aortoiliac atherosclerotic disease. The IVC is unremarkable. No portal venous gas. There is no adenopathy. Reproductive: The uterus is anteverted.  No adnexal masses. Other: Midline vertical anterior abdominal wall incisional scar. There is diastasis of anterior abdominal wall musculature with a broad based shallow hernia. Musculoskeletal: Degenerative changes of the spine. No acute osseous pathology. IMPRESSION: 1. No acute intrathoracic, abdominal, or pelvic pathology. 2. No bowel obstruction. 3. Fatty liver. 4. Cholelithiasis. 5. A 2.4 cm nodule inferior to the left thyroid lobe. Recommend thyroid US (ref: J Am Coll Radiol. 2015 Feb;12(2): 143-50). 6.  Aortic Atherosclerosis (ICD10-I70.0). Electronically Signed   By: Elgie Collard M.D.   On: 05/18/2023 22:11   CT Head Wo Contrast  Result Date: 05/18/2023 CLINICAL DATA:  Mental status change, unknown cause h/o neuroendocrine tumor EXAM: CT HEAD WITHOUT CONTRAST TECHNIQUE: Contiguous axial images were obtained from the base of the skull through the vertex without intravenous contrast. RADIATION DOSE REDUCTION: This exam was performed according to the departmental dose-optimization program which includes automated exposure control, adjustment of the mA and/or kV according to patient size and/or use of iterative reconstruction technique.  COMPARISON:  None Available. FINDINGS: Brain: No evidence of large-territorial acute infarction. No parenchymal hemorrhage. No mass lesion. No extra-axial collection. No mass effect or midline shift. No hydrocephalus. Basilar cisterns are patent. Vascular: No hyperdense vessel. Skull: No acute fracture or focal lesion. Sinuses/Orbits: Paranasal sinuses and mastoid air cells are clear. The orbits are unremarkable. Other: None. IMPRESSION: No acute intracranial abnormality. Electronically Signed   By: Tish Frederickson M.D.   On: 05/18/2023 22:03   DG Chest Port 1 View  Result Date: 05/18/2023 CLINICAL DATA:  Fever EXAM: PORTABLE CHEST 1 VIEW COMPARISON:  None Available. FINDINGS: Heart is mildly enlarged. Mediastinal contours within normal limits. No confluent airspace opacities or  effusions. No acute bony abnormality. IMPRESSION: Mild cardiomegaly.  No active disease. Electronically Signed   By: Charlett Nose M.D.   On: 05/18/2023 21:32    Initial path 2014 FINAL PATHOLOGIC DIAGNOSIS A.  RIGHT COLON AND DISTAL SMALL BOWEL, RESECTION:      Well differentiated neuroendocrine tumor, Grade 2, Intermediate grade (carcinoid tumor).      -  Tumor size: 3.0 cm in greatest dimension.      -  Lymphovascular invasion: Present      -  Perineural invasion: Present.      -  Lymph nodes: Six of seventeen lymph nodes positive for metastatic tumor (6/17).      -  Margins:  Uninvolved by tumor.      -  Appendix is positive for well differentiated neuroendocrine tumor.      -  Pathologic stage: pT4, pN1, pM1.      -  Other findings: Focal lymphoid hyperplasia and a submucosal lipoma.      See comment and tumor protocol.  B.  SMALL BOWEL MESENTERIC NODULE, BIOPSY:      Positive for well differentiated neuroendocrine tumor.  C.  RIGHT OVARY AND TUBE, RIGHT SALPINGO-OOPHORECTOMY:      Ovary: Positive for well differentiated neuroendocrine tumor.      Fallopian tube: No diagnostic abnormality, negative  for malignancy.  D.  LEFT OVARY AND TUBE, LEFT SALPINGO-OOPHORECTOMY:       Ovary: Positive for well differentiated neuroendocrine tumor.      Fallopian tube: No diagnostic abnormality, negative for malignancy.                 COMMENT:  The tumor shows approximately 3 mitotic figures per 10 high power fields and the Ki-67 shows a proliferation index ranging between 2-5%. The tumor is best classified as grade 2.    ASSESSMENT & PLAN: 77 year old female  Metastatic well-differentiated carcinoid tumor -I reviewed her detailed medical history with the patient and her sister.  -08/2012 Initially diagnosed on screening colonoscopy, 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 Went on observation  -04/2014 liver metastasis on MRI, showing multiple lesions suspicious for metastatic NET.  Symptoms of flushing and diarrhea were present. Continued observation until carcinoid syndrome worsened  -04/2016 began first-line octreotide Cortez mg, lost f/up 2018 - 07/2017 off sandostatin. Sx worsened  -S/p Lutathera x4 (08/23/17, 12/Cortez/18, 2/29/19, and 02/14/18 + sandostatin) and continued sandostatin.   -Relocated to [place] 11/2018, followed by [facility] where she continued monthly Sandostatin - 01/13/2021, d/c'd per pt due to lack of efficacy  -2nd opinion at [facility], continued supportive care -Began lanreotide 08/2021 and retreated with Lutathera 08/2021 - 02/10/22 x 3 doses; dose held due to poor tolerance (fatigue and nausea).  -Last lanreotide 01/2022 -Anna Cortez appears stable. She was recently discharged from hospital for viral meningitis, she has recovered well.  -She has new onset right abdominal pain, low grade fevers, and concern for UTI. Will check UA/culture.  Recent chromogranin A in the hospital was normal, and today's CBC and CMP are reassuring -She has known liver metastasis but has not been symptomatic with RUQ pain deviously; not clear what this pain is related to.  -We are recommending dotatate PET for new baseline -We reviewed symptom management for diarrhea. She failed imodium  and [place]. Will try lomotil. If that does not work, she is interested in [employer]. Will see if insurance covers.  -F/up after dotatate PET -Pt seen with Dr. Mosetta Putt   Diarrhea, flushing, abdominal pain -Secondary to carcinoid syndrome #1 -She has tried Imodium without efficacy, side notes suggest she has failed Questran as well.  Will try Lomotil -Is interested in Xermelo if not cost prohibitive -She has previously not been symptomatic with pain from the liver metastasis, right upper quadrant and abdominal pain is new, but better today -Unclear if related to NET or other.  -Proceeding with dotatate PET for new baseline  Low-grade fever, recent hospitalization with viral meningitis  -She recently had viral meningitis, improving clinically; headaches nearly resolved -She is concerned for UTI, with concentrated urine and low-grade fevers, will check UA/culture today  Social -She lives independently but is very close with her sister who is here today and heavily involved in her care    PLAN: -Extensive records and today's labs reviewed -F/up pending UA/culture -Rx: lomotil and Xermelo (if lomotil does not work and covered by Community education officer) -Restaging Dotatate PET scan in 1 week -F/up 1 week after PET, she will bring in 5HIAA urine test -Referral to PCP per pt request -Pt seen with Dr. Mosetta Putt    Orders Placed This Encounter  Procedures   Urine Culture    Standing Status:   Future    Number of Occurrences:   1    Standing Expiration Date:   05/30/2024   NM PET DOTATATE SKULL BASE TO MID THIGH    Standing Status:   Future    Standing Expiration Date:   05/30/2024     Order Specific Question:   If indicated for the ordered procedure, I authorize the administration of a radiopharmaceutical per Radiology protocol    Answer:   Yes    Order Specific Question:   Preferred imaging location?    Answer:   [facility]   CBC with Differential (Cancer Center Only)    Standing Status:   Future    Number of Occurrences:   1    Standing Expiration Date:   05/30/2024   CMP (Cancer Center only)    Standing Status:   Future    Number of Occurrences:   1    Standing Expiration Date:   05/30/2024   Urinalysis, Complete w Microscopic    Standing Status:   Future    Number of Occurrences:   1    Standing Expiration Date:   05/30/2024   24 Hr Ur 5 HIAA Qnt    Standing Status:   Future    Standing Expiration Date:   05/30/2024   Ambulatory referral to Internal Medicine    Referral Priority:   Routine    Referral Type:   Consultation    Referral Reason:   Specialty Services Required    Referred to Provider:   Pincus Sanes, MD    Requested Specialty:   Internal Medicine    Number of Visits Requested:   1    All questions were answered. The patient knows to call the clinic with any problems, questions or concerns.      Pollyann Samples, NP 05/31/2023   Addendum I have seen the patient, examined her. I agree with the assessment and and plan and have edited the notes.   77 yo female with PMH of metastatic carcinoid tumor of the cecum, initially diagnosed in 2013.  She has been on Sandostatin injections,  did receive Lutathera treatment in 2023, but did not complete last dose due to poor tolerance.  She was recently hospitalized for virus meningitis, she is recovering well.  I reviewed her CT and MRI scan during her recent hospital stay.  Will obtain I repeated dotatate PET scan as a baseline, and continue Sandostatin injection.  We reviewed the nature of metastatic neuroendocrine tumor, and treatment options, including oral medicine such as Afinitor. Will also obtain her previous  scans from [place] for comparison.  All questions were answered.  We spent a total of 40 minutes for her care today, including extensive chart review.   Malachy Mood MD 05/31/2023

## 2023-05-31 ENCOUNTER — Inpatient Hospital Stay: Payer: Medicare Other | Attending: Nurse Practitioner | Admitting: Nurse Practitioner

## 2023-05-31 ENCOUNTER — Other Ambulatory Visit: Payer: Self-pay

## 2023-05-31 ENCOUNTER — Inpatient Hospital Stay: Payer: Medicare Other

## 2023-05-31 ENCOUNTER — Encounter: Payer: Self-pay | Admitting: Nurse Practitioner

## 2023-05-31 VITALS — BP 135/64 | HR 69 | Temp 97.8°F | Resp 18 | Ht 69.0 in | Wt 175.7 lb

## 2023-05-31 DIAGNOSIS — R509 Fever, unspecified: Secondary | ICD-10-CM | POA: Insufficient documentation

## 2023-05-31 DIAGNOSIS — E340 Carcinoid syndrome: Secondary | ICD-10-CM | POA: Insufficient documentation

## 2023-05-31 DIAGNOSIS — C7B02 Secondary carcinoid tumors of liver: Secondary | ICD-10-CM | POA: Insufficient documentation

## 2023-05-31 DIAGNOSIS — Z8042 Family history of malignant neoplasm of prostate: Secondary | ICD-10-CM | POA: Insufficient documentation

## 2023-05-31 DIAGNOSIS — R109 Unspecified abdominal pain: Secondary | ICD-10-CM | POA: Diagnosis not present

## 2023-05-31 DIAGNOSIS — R232 Flushing: Secondary | ICD-10-CM | POA: Insufficient documentation

## 2023-05-31 DIAGNOSIS — C7A022 Malignant carcinoid tumor of the ascending colon: Secondary | ICD-10-CM | POA: Insufficient documentation

## 2023-05-31 DIAGNOSIS — R197 Diarrhea, unspecified: Secondary | ICD-10-CM | POA: Insufficient documentation

## 2023-05-31 DIAGNOSIS — C7B00 Secondary carcinoid tumors, unspecified site: Secondary | ICD-10-CM

## 2023-05-31 DIAGNOSIS — R300 Dysuria: Secondary | ICD-10-CM

## 2023-05-31 LAB — CMP (CANCER CENTER ONLY)
ALT: 21 U/L (ref 0–44)
AST: 19 U/L (ref 15–41)
Albumin: 3.9 g/dL (ref 3.5–5.0)
Alkaline Phosphatase: 71 U/L (ref 38–126)
Anion gap: 9 (ref 5–15)
BUN: 15 mg/dL (ref 8–23)
CO2: 24 mmol/L (ref 22–32)
Calcium: 9.2 mg/dL (ref 8.9–10.3)
Chloride: 107 mmol/L (ref 98–111)
Creatinine: 0.85 mg/dL (ref 0.44–1.00)
GFR, Estimated: >60 mL/min (ref >60)
Glucose, Bld: 108 mg/dL — ABNORMAL HIGH (ref 70–99)
Potassium: 4.2 mmol/L (ref 3.5–5.1)
Sodium: 140 mmol/L (ref 135–145)
Total Bilirubin: 0.7 mg/dL (ref 0.3–1.2)
Total Protein: 7.1 g/dL (ref 6.5–8.1)

## 2023-05-31 LAB — URINALYSIS, COMPLETE (UACMP) WITH MICROSCOPIC
Bacteria, UA: NONE SEEN
Bilirubin Urine: NEGATIVE
Glucose, UA: NEGATIVE mg/dL
Hgb urine dipstick: NEGATIVE
Ketones, ur: NEGATIVE mg/dL
Leukocytes,Ua: NEGATIVE
Nitrite: NEGATIVE
Protein, ur: NEGATIVE mg/dL
Specific Gravity, Urine: 1.018 (ref 1.005–1.030)
Trans Epithel, UA: 2
pH: 5 (ref 5.0–8.0)

## 2023-05-31 LAB — CBC WITH DIFFERENTIAL (CANCER CENTER ONLY)
Abs Immature Granulocytes: 0.03 10*3/uL (ref 0.00–0.07)
Basophils Absolute: 0.1 10*3/uL (ref 0.0–0.1)
Basophils Relative: 1 %
Eosinophils Absolute: 0.1 10*3/uL (ref 0.0–0.5)
Eosinophils Relative: 1 %
HCT: 36.7 % (ref 36.0–46.0)
Hemoglobin: 12.8 g/dL (ref 12.0–15.0)
Immature Granulocytes: 0 %
Lymphocytes Relative: 18 %
Lymphs Abs: 1.4 10*3/uL (ref 0.7–4.0)
MCH: 34 pg (ref 26.0–34.0)
MCHC: 34.9 g/dL (ref 30.0–36.0)
MCV: 97.3 fL (ref 80.0–100.0)
Monocytes Absolute: 0.6 10*3/uL (ref 0.1–1.0)
Monocytes Relative: 8 %
Neutro Abs: 5.4 10*3/uL (ref 1.7–7.7)
Neutrophils Relative %: 72 %
Platelet Count: 227 10*3/uL (ref 150–400)
RBC: 3.77 MIL/uL — ABNORMAL LOW (ref 3.87–5.11)
RDW: 12.9 % (ref 11.5–15.5)
WBC Count: 7.5 10*3/uL (ref 4.0–10.5)
nRBC: 0 % (ref 0.0–0.2)

## 2023-05-31 MED ORDER — XERMELO 250 MG PO TABS
1.0000 | ORAL_TABLET | Freq: Three times a day (TID) | ORAL | 0 refills | Status: DC
  Filled 2023-05-31 – 2023-06-01 (×2): qty 90, 30d supply, fill #0

## 2023-05-31 MED ORDER — DIPHENOXYLATE-ATROPINE 2.5-0.025 MG PO TABS: 1.0000 | ORAL_TABLET | Freq: Four times a day (QID) | ORAL | 1 refills | Status: DC | PRN

## 2023-06-01 ENCOUNTER — Telehealth: Payer: Self-pay | Admitting: Pharmacy Technician

## 2023-06-01 ENCOUNTER — Other Ambulatory Visit: Payer: Self-pay

## 2023-06-01 ENCOUNTER — Other Ambulatory Visit (HOSPITAL_COMMUNITY): Payer: Self-pay

## 2023-06-01 ENCOUNTER — Telehealth: Payer: Self-pay | Admitting: Pharmacist

## 2023-06-01 DIAGNOSIS — C7B00 Secondary carcinoid tumors, unspecified site: Secondary | ICD-10-CM

## 2023-06-01 MED ORDER — XERMELO 250 MG PO TABS
1.0000 | ORAL_TABLET | Freq: Three times a day (TID) | ORAL | 0 refills | Status: DC
Start: 2023-06-01 — End: 2023-06-28
  Filled 2023-06-01: qty 84, 28d supply, fill #0

## 2023-06-01 NOTE — Telephone Encounter (Signed)
Oral Chemotherapy Pharmacist Encounter  Start date: 06/06/23 (patient will take Lomotil over the weekend until [name] is delivered to her home)  Patient Education I spoke with patient for overview of new medication: Xermelo (telotristat ethyl) for the treatment of carcinoid syndrome diarrhea.   Pt is doing well. Counseled patient on administration, dosing, side effects, monitoring, drug-food interactions, safe handling, storage, and disposal.  Patient will take Xermelo 250 mg tablet, 1 tablet by mouth 3 (three) times daily. Patient knows to take with food.  Side effects include but not limited to: nausea, headache, abdominal pain.    Reviewed with patient importance of keeping a medication schedule and plan for any missed doses.  After discussion with patient no patient barriers to medication adherence identified.   Anna Cortez voiced understanding and appreciation. All questions answered. Medication handout provided.  Provided patient with Oral Chemotherapy Navigation Clinic phone number. Patient knows to call the office with questions or concerns.  Lenord Carbo, PharmD, BCPS, BCOP Hematology/Oncology Clinical Pharmacist [facility] Oral Chemotherapy Navigation Clinics [phone] 06/01/2023 12:40 PM

## 2023-06-01 NOTE — Telephone Encounter (Signed)
Oral Oncology Patient Advocate Encounter   Was successful in obtaining a copay card for [facility].  This copay card will make the patients copay $0.  I have spoken with the patient.    The billing information is as follows and has been shared with [facility].   RxBin: [id number] PCN: [id number] Member ID: [id number] Group ID: [id number]   Jinger Neighbors, CPhT-Adv Oncology Pharmacy Patient Advocate [facility] Direct Number: [phone]  Fax: [phone]

## 2023-06-01 NOTE — Telephone Encounter (Signed)
Oral Oncology Pharmacist Encounter  Received new prescription for Xermelo (telotristat ethyl) for the treatment of carcinoid syndrome diarrhea.  CBC w/ Diff and CMP from 05/31/23 assessed, no relevant lab abnormalities requiring baseline dose adjustment required at this time. Prescription dose and frequency assessed for appropriateness.  Current medication list in Epic reviewed, no relevant/significant DDIs with Xermelo identified.  Evaluated chart and no patient barriers to medication adherence noted.   Patient agreement for treatment documented in MD note on 05/31/23.  Prescription has been e-scribed to the [facility] for benefits analysis and approval.  Oral Oncology Clinic will continue to follow for insurance authorization, copayment issues, initial counseling and start date.  Lenord Carbo, PharmD, BCPS, BCOP Hematology/Oncology Clinical Pharmacist [facility] Oral Chemotherapy Navigation Clinics [phone] 06/01/2023 8:40 AM

## 2023-06-01 NOTE — Telephone Encounter (Signed)
Oral Oncology Patient Advocate Encounter  After completing a benefits investigation, prior authorization for [name] is not required at this time through [facility].  Patient's copay is $50.     Jinger Neighbors, CPhT-Adv Oncology Pharmacy Patient Advocate [facility] Direct Number: [phone]  Fax: [phone]

## 2023-06-04 ENCOUNTER — Telehealth: Payer: Self-pay

## 2023-06-04 DIAGNOSIS — C7A022 Malignant carcinoid tumor of the ascending colon: Secondary | ICD-10-CM | POA: Diagnosis not present

## 2023-06-04 NOTE — Telephone Encounter (Addendum)
Called patient to relay message below as per Santiago Glad NP. Patient voiced full understanding.    ----- Message from Pollyann Samples sent at 06/01/2023  2:09 PM EDT ----- Please let pt know she does not have UTI.  Thanks, Clayborn Heron NP

## 2023-06-05 ENCOUNTER — Telehealth: Payer: Self-pay

## 2023-06-05 ENCOUNTER — Encounter: Payer: Self-pay | Admitting: Nurse Practitioner

## 2023-06-05 NOTE — Telephone Encounter (Signed)
Received call X2 from Annabelle Harman at [facility] inquiring about [facility] as acting primary for home health. Attempted to call Epifanio Lesches at [phone]. Unable to LVM

## 2023-06-06 ENCOUNTER — Other Ambulatory Visit: Payer: Self-pay

## 2023-06-08 ENCOUNTER — Telehealth: Payer: Self-pay | Admitting: Hematology

## 2023-06-11 ENCOUNTER — Encounter: Payer: Self-pay | Admitting: Nurse Practitioner

## 2023-06-18 NOTE — Progress Notes (Signed)
I called [facility] to request 06/15/2021 PET scan images be pushed to powershare.  They are unable to push to power share.  I faxed a request for images to be copied to a disc and be mailed to [facility] to my attention.

## 2023-06-21 ENCOUNTER — Other Ambulatory Visit (HOSPITAL_COMMUNITY): Payer: Self-pay

## 2023-06-21 ENCOUNTER — Encounter (HOSPITAL_COMMUNITY)
Admission: RE | Admit: 2023-06-21 | Discharge: 2023-06-21 | Disposition: A | Discharge status: Home / Self Care | Payer: Medicare Other | Source: Ambulatory Visit | Attending: Nurse Practitioner | Admitting: Nurse Practitioner

## 2023-06-21 DIAGNOSIS — C7B00 Secondary carcinoid tumors, unspecified site: Secondary | ICD-10-CM | POA: Diagnosis present

## 2023-06-21 DIAGNOSIS — E049 Nontoxic goiter, unspecified: Secondary | ICD-10-CM | POA: Insufficient documentation

## 2023-06-21 DIAGNOSIS — E041 Nontoxic single thyroid nodule: Secondary | ICD-10-CM | POA: Insufficient documentation

## 2023-06-21 DIAGNOSIS — C7B02 Secondary carcinoid tumors of liver: Secondary | ICD-10-CM | POA: Diagnosis not present

## 2023-06-21 DIAGNOSIS — C7A020 Malignant carcinoid tumor of the appendix: Secondary | ICD-10-CM | POA: Insufficient documentation

## 2023-06-21 MED ORDER — COPPER CU 64 DOTATATE 1 MCI/ML IV SOLN
4.0000 | Freq: Once | INTRAVENOUS | Status: AC
  Administered 2023-06-21: 3.46 via INTRAVENOUS

## 2023-06-21 MED ORDER — GALLIUM GA 68 DOTATATE IV KIT: 3.4600 | PACK | Freq: Once | INTRAVENOUS | Status: DC

## 2023-06-26 ENCOUNTER — Other Ambulatory Visit: Payer: Self-pay

## 2023-06-27 ENCOUNTER — Other Ambulatory Visit: Payer: Self-pay

## 2023-06-27 ENCOUNTER — Inpatient Hospital Stay
Admission: RE | Admit: 2023-06-27 | Discharge: 2023-06-27 | Disposition: A | Discharge status: Home / Self Care | Payer: Self-pay | Source: Ambulatory Visit | Attending: Hematology | Admitting: Hematology

## 2023-06-27 DIAGNOSIS — C7B00 Secondary carcinoid tumors, unspecified site: Secondary | ICD-10-CM

## 2023-06-27 NOTE — Progress Notes (Addendum)
Patient Care Team: Pincus Sanes, MD as PCP - General (Internal Medicine) Malachy Mood, MD as Consulting Physician (Hematology and Oncology) Pollyann Samples, NP as Nurse Practitioner (Hematology and Oncology) Carlean Jews, NP as Nurse Practitioner (Hematology and Oncology)   CHIEF COMPLAINT: Follow-up metastatic neuroendocrine tumor   Oncology History  Metastatic carcinoid tumor [facility])  11/2012 Initial Diagnosis   Right hemicolectomy and bilateral salpingo-oophorectomy: 3 cm grade 2 well-differentiated neuroendocrine tumor lymphovascular invasion and perineural invasion present, 6/17 lymph nodes positive, 3 mitosis per 10 high-power fields Ki-67 2 to 5% appendix positive, small bowel mesenteric nodule positive, right and left ovaries positive T4 N1 M1   03/2014 Imaging   Liver metastases   04/2016 Imaging   Dotatate PET: Left subdiaphragmatic lesion 1 cm   04/2016 Miscellaneous   Sandostatin LAR continued till October 2019 and then she was lost to follow-up   08/07/2017 Imaging   Redemonstration of multiple dotatate avid lesions in the liver suggestive of residual neuroendocrine metastases uptake has decreased   08/23/2017 Miscellaneous   Until a 02/14/2018 4 doses of Lutathera given, Sandostatin continued (all the above care was done at [facility] and then transferred her care to [facility] who started on Telotristat Ethyl for diarrhea orally   08/17/2018 Imaging   Dotatate PET: Similar distribution liver and left phrenic lymph node mild uptake in right supraclavicular lymph node, 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 Socarras returns for follow-up as  scheduled, last seen by me and Dr. Mosetta Putt as a new patient 05/31/2023.  She started [facility] for diarrhea and had a dotatate PET scan in the interim.  Today she presents by herself, feeling well in general.  Headaches and low-grade fevers have resolved.  She continues working.  She started Xermelo TID, thought diarrhea was improving at first. She has 3 morning episodes of diarrhea after breakfast, then doesn't eat much throughout the day to avoid diarrhea at work. She did not start lomotil. Has occasional flushing/sweats and nausea. Denies abdominal pain or cramping. She mainly wants something to help the diarrhea.   ROS  All other systems reviewed and negative  Past Medical History:  Diagnosis Date   Degenerative disc disease    Insomnia    Osteoarthritis      Past Surgical History:  Procedure Laterality Date   KNEE SURGERY     Right Knee   TUBAL LIGATION     WISDOM TOOTH EXTRACTION       Outpatient Encounter Medications as of 06/28/2023  Medication Sig   acetaminophen (TYLENOL) 325 MG tablet Take 2 tablets (650 mg total) by mouth every 6 (six) hours as needed for mild pain (or Fever >/= 101).   aspirin-acetaminophen-caffeine (EXCEDRIN MIGRAINE) 250-250-65 MG tablet Take 1 tablet by mouth every 6 (six) hours as needed for headache or migraine. (Patient not taking: Reported on 05/31/2023)   diphenoxylate-atropine (LOMOTIL) 2.5-0.025 MG tablet Take 1-2 tablets by mouth 4 (four) times daily as needed for diarrhea or loose stools.   [DISCONTINUED] Telotristat Ethyl,as Etiprate, (XERMELO) 250 MG TABS Take 1 tablet (250 mg total) by mouth 3 (three) times daily. Take with food.   No facility-administered encounter medications on  file as of 06/28/2023.     Today's Vitals   06/28/23 0831  BP: 138/65  Pulse: 71  Resp: 18  Temp: 98 F (36.7 C)  TempSrc: Oral  SpO2: 99%  Weight: 176 lb 5 oz (80 kg)  Height: 5\' 9"  (1.753 m)   Body mass index is 26.04 kg/m.   PHYSICAL EXAM GENERAL:alert, no  distress and comfortable SKIN: no rash  EYES: sclera clear NECK: without mass LYMPH:  no palpable cervical or supraclavicular lymphadenopathy  LUNGS: clear with normal breathing effort HEART: regular rate & rhythm, no lower extremity edema ABDOMEN: abdomen soft, non-tender and normal bowel sounds NEURO: alert & oriented x 3 with fluent speech, no focal motor/sensory deficits   CBC    Component Value Date/Time   WBC 7.5 05/31/2023 0935   WBC 5.5 05/23/2023 1046   RBC 3.77 (L) 05/31/2023 0935   HGB 12.8 05/31/2023 0935   HCT 36.7 05/31/2023 0935   PLT 227 05/31/2023 0935   MCV 97.3 05/31/2023 0935   MCH 34.0 05/31/2023 0935   MCHC 34.9 05/31/2023 0935   RDW 12.9 05/31/2023 0935   LYMPHSABS 1.4 05/31/2023 0935   MONOABS 0.6 05/31/2023 0935   EOSABS 0.1 05/31/2023 0935   BASOSABS 0.1 05/31/2023 0935     CMP     Component Value Date/Time   NA 140 05/31/2023 0935   K 4.2 05/31/2023 0935   CL 107 05/31/2023 0935   CO2 24 05/31/2023 0935   GLUCOSE 108 (H) 05/31/2023 0935   BUN 15 05/31/2023 0935   CREATININE 0.85 05/31/2023 0935   CREATININE 0.93 12/22/2011 1145   CALCIUM 9.2 05/31/2023 0935   PROT 7.1 05/31/2023 0935   ALBUMIN 3.9 05/31/2023 0935   AST 19 05/31/2023 0935   ALT 21 05/31/2023 0935   ALKPHOS 71 05/31/2023 0935   BILITOT 0.7 05/31/2023 0935   GFRNONAA >60 05/31/2023 0935     ASSESSMENT & PLAN:77 year old female   Metastatic well-differentiated carcinoid tumor -I reviewed her detailed medical history with the patient and her sister.  -08/2012 Initially diagnosed on screening colonoscopy, 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 Went on observation  -04/2014 liver metastasis on MRI, showing multiple lesions suspicious for metastatic NET.   Symptoms of flushing and diarrhea were present. Continued observation until carcinoid syndrome worsened  -04/2016 began first-line octreotide 20 mg, lost f/up 2018 - 07/2017 off sandostatin. Sx worsened  -S/p Lutathera x4 (08/23/17, 10/18/17, 2/29/19, and 02/14/18 + sandostatin) and continued sandostatin.   -Relocated to [place] 11/2018, followed by [facility] where she continued monthly Sandostatin - 01/13/2021, d/c'd per pt due to lack of efficacy  -2nd opinion at [facility], continued supportive care -Began lanreotide 08/2021 and retreated with Lutathera 08/2021 - 02/10/22 x 3 doses; dose held due to poor tolerance (fatigue and nausea).  -Last lanreotide 01/2022 -Hospitalized 04/2023 for viral meningitis, recovered well -05/31/23: Chromogranin A and 5HIAA urine normal, started Xermelo for diarrhea -06/28/23: Ms. Anna Cortez appears well. Weight is stable. Diarrhea slightly improved on Xermelo, I recommend to add lomotil for additional results.  -We reviewed dotatate PET scan which shows 2 liver hypermetabolic liver lesions c/w metastatic NET. Still waiting on images from [facility] (disc has been requested), but per the report from last PET scan in 2022 this is improvement.  -  However, she is symptomatic with carcinoid syndrome diarrhea.  -Pt seen with Dr. Mosetta Putt who reviewed treatment options. These may be too large to ablate, but likely amenable to Y90. Will discuss in tumor board. She is interested.  -We discussed other options such as Affinitor, if she is not a candidate for targeted therapy or she has progression in the future -I have referred her to IR, and will call her after conference next week -F/up with me in 3 weeks, or sooner if needed   Diarrhea, flushing, abdominal pain -Secondary to carcinoid syndrome #1 -She has tried Imodium without efficacy, side notes suggest she has failed Questran as well.   -05/31/23: began [name], partially effective -06/28/23: I recommend to add lomotil PRN    Low-grade fever, recent hospitalization with viral meningitis  -She recently had viral meningitis, improving clinically; headaches nearly resolved -UA/culture negative -Resolved   Social -She lives independently but is very close with her sister who is involved in her care      PLAN: -PET scan reviewed -Referral to IR to consider targeted therapy, such as Y90 -Will discuss in tumor board and let pt know  -Continue Xermelo, add lomotil for carcinoid syndrome diarrhea -Lab and F/up in 3 months -Pt seen with Dr. 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    Answer:   [facility]      All questions were answered. The patient knows to call the clinic with any problems, questions or concerns. No barriers to learning were detected.   Santiago Glad, NP-C 06/28/2023  Addendum I have seen the patient, examined her. I agree with the assessment and and plan and have edited the notes.   I have reviewed her restaging dotatate PET scan, which showed 2 liver metastasis, no other evidence of additional metastasis.  Given the low disease burden, and previous progression on Sandostatin and lanreotide injections, patient does not want restart those injections.  She previously had received treatment twice.  We discussed other treatment options such as Y90, or bland embolization.  Patient is not very interested in additional treatment for her metastatic neuroendocrine tumor, but open to consult with IR.  Will refer her.  She is more interested in quality of life and how to manage her diarrhea.  She will continue to use Xermelo and we encouraged her to increase Lomotil to  control her diarrhea.  Will continue surveillance at this point.  All questions were answered.  I spent a total of 25 minutes for her visit today, more than 50% time on face-to-face counseling.  Malachy Mood MD 06/28/2023

## 2023-06-28 ENCOUNTER — Encounter: Payer: Self-pay | Admitting: Nurse Practitioner

## 2023-06-28 ENCOUNTER — Other Ambulatory Visit: Payer: Self-pay

## 2023-06-28 ENCOUNTER — Inpatient Hospital Stay (HOSPITAL_BASED_OUTPATIENT_CLINIC_OR_DEPARTMENT_OTHER): Payer: Medicare Other | Admitting: Nurse Practitioner

## 2023-06-28 ENCOUNTER — Other Ambulatory Visit (HOSPITAL_COMMUNITY): Payer: Self-pay

## 2023-06-28 ENCOUNTER — Other Ambulatory Visit: Payer: Self-pay | Admitting: Hematology

## 2023-06-28 VITALS — BP 138/65 | HR 71 | Temp 98.0°F | Resp 18 | Ht 69.0 in | Wt 176.3 lb

## 2023-06-28 DIAGNOSIS — C7B00 Secondary carcinoid tumors, unspecified site: Secondary | ICD-10-CM | POA: Diagnosis not present

## 2023-06-28 DIAGNOSIS — C7A022 Malignant carcinoid tumor of the ascending colon: Secondary | ICD-10-CM | POA: Diagnosis not present

## 2023-06-28 MED ORDER — XERMELO 250 MG PO TABS
1.0000 | ORAL_TABLET | Freq: Three times a day (TID) | ORAL | 0 refills | Status: DC
Start: 2023-06-28 — End: 2023-07-19
  Filled 2023-06-28: qty 84, 28d supply, fill #0

## 2023-06-29 ENCOUNTER — Other Ambulatory Visit (HOSPITAL_COMMUNITY): Payer: Self-pay

## 2023-06-29 NOTE — Progress Notes (Signed)
IR referral, ov note, demographics and insurance information faxed to [facility] Attn: Vickie.

## 2023-07-04 ENCOUNTER — Other Ambulatory Visit: Payer: Self-pay

## 2023-07-04 NOTE — Progress Notes (Signed)
The proposed treatment discussed in conference is for discussion purpose only and is not a binding recommendation.  The patients have not been physically examined, or presented with their treatment options.  Therefore, final treatment plans cannot be decided.  

## 2023-07-05 ENCOUNTER — Other Ambulatory Visit (HOSPITAL_COMMUNITY): Payer: Self-pay

## 2023-07-09 ENCOUNTER — Ambulatory Visit
Admission: RE | Admit: 2023-07-09 | Discharge: 2023-07-09 | Disposition: A | Discharge status: Home / Self Care | Payer: BC Managed Care – PPO | Source: Ambulatory Visit | Attending: Nurse Practitioner | Admitting: Nurse Practitioner

## 2023-07-09 ENCOUNTER — Inpatient Hospital Stay
Admission: RE | Admit: 2023-07-09 | Discharge: 2023-07-09 | Disposition: A | Discharge status: Home / Self Care | Payer: Self-pay | Source: Ambulatory Visit | Attending: Hematology | Admitting: Hematology

## 2023-07-09 ENCOUNTER — Other Ambulatory Visit: Payer: Self-pay | Admitting: Interventional Radiology

## 2023-07-09 ENCOUNTER — Other Ambulatory Visit (HOSPITAL_COMMUNITY): Payer: Self-pay | Admitting: Hematology

## 2023-07-09 DIAGNOSIS — C7B00 Secondary carcinoid tumors, unspecified site: Secondary | ICD-10-CM

## 2023-07-09 DIAGNOSIS — Z01818 Encounter for other preprocedural examination: Secondary | ICD-10-CM

## 2023-07-09 DIAGNOSIS — C7B8 Other secondary neuroendocrine tumors: Secondary | ICD-10-CM

## 2023-07-09 HISTORY — PX: IR RADIOLOGIST EVAL & MGMT: IMG5224

## 2023-07-09 NOTE — Consult Note (Signed)
Chief Complaint: Neuroendocrine mets, carcinoid syndrome  Referring Physician(s): Burton,Lacie K  History of Present Illness: Anna Cortez is a 77 y.o. female presenting today to [facility] clinic, kindly referred by the office of Dr. 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   Anna Cortez is here today by herself for the interview.   She describes her past history of treatment for the neuroendorine tumor (in detail below), and her current symptoms. Currently, her main complaint is loss of sleep, as she finds it difficult to fall asleep daily, and she is frequently woken in the middle of the night by flushing.  Also, she has diarrhea daily.  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  She complains also of flushing and hot flashes.   She denies any loss of appetite.  She denies hematemesis, melena, hematochezia.  She has occasional nausea, but not vomiting.   She is currently establishing PCP, with new appointment in early October.   Her family lives nearby.  She lives alone.   The electronic record details her prior treatment below and the note from 06/28/23: Oncology History  Metastatic carcinoid tumor (HCC)  11/2012 Initial Diagnosis    Right hemicolectomy and bilateral salpingo-oophorectomy: 3 cm grade 2 well-differentiated neuroendocrine tumor lymphovascular invasion and perineural invasion present, 6/17 lymph nodes positive, 3 mitosis per 10 high-power fields Ki-67 2 to 5% appendix positive, small bowel mesenteric nodule positive, right and left ovaries positive T4 N1 M1    03/2014 Imaging    Liver metastases    04/2016 Imaging    Dotatate PET: Left subdiaphragmatic lesion 1 cm    04/2016 Miscellaneous    Sandostatin LAR continued till October 2019 and then she was lost to  follow-up    08/07/2017 Imaging    Redemonstration of multiple dotatate avid lesions in the liver suggestive of residual neuroendocrine metastases uptake has decreased    08/23/2017 Miscellaneous    Until a 02/14/2018 4 doses of Lutathera given, Sandostatin continued (all the above care was done at [facility] and then transferred her care to [facility] who started on Telotristat Ethyl for diarrhea orally    08/17/2018 Imaging    Dotatate PET: Similar distribution liver and left phrenic lymph node mild uptake in right supraclavicular lymph node, 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    She continues to work.   She has never had ECHO.   MRI 05/22/23 = 6 small hepatic lesions identified.  The largest is ~ 1cm.   DOTATATE PET 06/21/23 = Only 2 of the 6 hepatic lesions are confidently identified.  This includes the largest on MRI, as well as another in the left liver.       There is no correlate to the remainder of the MRI findings on the PET.    There is no imaging available to review before this year.    Past Medical History:  Diagnosis Date  Degenerative disc disease    Insomnia    Osteoarthritis     Past Surgical History:  Procedure Laterality Date   KNEE SURGERY     Right Knee   TUBAL LIGATION     WISDOM TOOTH EXTRACTION      Allergies: Astemizole, Penicillins, Prochlorperazine edisylate, and Tolectin [tolmetin sodium]  Medications: Prior to Admission medications   Medication Sig Start Date End Date Taking? Authorizing Provider  acetaminophen (TYLENOL) 325 MG tablet Take 2 tablets (650 mg total) by mouth every 6  (six) hours as needed for mild pain (or Fever >/= 101). 05/24/23   Azucena Fallen, MD  aspirin-acetaminophen-caffeine (EXCEDRIN MIGRAINE) [phone] MG tablet Take 1 tablet by mouth every 6 (six) hours as needed for headache or migraine. Patient not taking: Reported on 05/31/2023 05/24/23   Azucena Fallen, MD  diphenoxylate-atropine (LOMOTIL) 2.5-0.025 MG tablet Take 1-2 tablets by mouth 4 (four) times daily as needed for diarrhea or loose stools. 05/31/23   Pollyann Samples, NP  Telotristat Ethyl,as Etiprate, (XERMELO) 250 MG TABS Take 1 tablet (250 mg total) by mouth 3 (three) times daily. Take with food. 06/28/23   Malachy Mood, MD     Family History  Problem Relation Age of Onset   Heart disease Neg Hx    Prostate cancer Father    Colon cancer Neg Hx    Breast cancer Neg Hx    Hypertension Neg Hx    Diabetes Neg Hx    Hyperlipidemia Neg Hx     Social History   Socioeconomic History   Marital status: Single    Spouse name: Not on file   Number of children: Not on file   Years of education: Not on file   Highest education level: Not on file  Occupational History   Not on file  Tobacco Use   Smoking status: Never   Smokeless tobacco: Never  Substance and Sexual Activity   Alcohol use: Yes    Comment: beer   Drug use: No   Sexual activity: Never  Other Topics Concern   Not on file  Social History Narrative   Not on file   Social Determinants of Health   Financial Resource Strain: Not on file  Food Insecurity: No Food Insecurity (05/22/2023)   Hunger Vital Sign    Worried About Running Out of Food in the Last Year: Never true    Ran Out of Food in the Last Year: Never true  Transportation Needs: No Transportation Needs (05/22/2023)   PRAPARE - Administrator, Civil Service (Medical): No    Lack of Transportation (Non-Medical): No  Physical Activity: Not on file  Stress: Not on file  Social Connections: Not on file    ECOG Status: 1 - Symptomatic but  completely ambulatory  Review of Systems: A 12 point ROS discussed and pertinent positives are indicated in the HPI above.  All other systems are negative.  Review of Systems  Vital Signs: BP (!) 162/68   Pulse 64   Temp 98.3 F (36.8 C)   SpO2 98%   Advance Care Plan: The advanced care plan/surrogate decision maker was discussed at the time of visit and documented in the medical record.    Physical Exam General: 77 yo female appearing younger than stated age.  Well-developed, well-nourished.  No distress. HEENT: Atraumatic, normocephalic.  Glasses. Conjugate gaze, extra-ocular motor intact. No scleral icterus or scleral injection. No lesions on external ears, nose, lips, or gums.  Oral  mucosa moist, pink.  Neck: Symmetric with no goiter enlargement.  Chest/Lungs:  Symmetric chest with inspiration/expiration.  No labored breathing.  Clear to auscultation with no wheezes, rhonchi, or rales.  Heart:  RRR, with no third heart sounds appreciated. No JVD appreciated.  Abdomen:  Soft, NT/ND, with + bowel sounds.   Genito-urinary: Deferred Neurologic: Alert & Oriented to person, place, and time.   Normal affect and insight.  Appropriate questions.  Moving all 4 extremities with gross sensory intact.  Pulse Exam:  No bruit appreciated.    Palpable right and left radial pulse.  Palpable right popliteal artery & left popliteal artery.  Palpable left and right PT.  Extremities: hairless.  No swelling.  Right thigh varicosity gravity dependent   Imaging: NM PET DOTATATE SKULL BASE TO MID THIGH  Result Date: 06/21/2023 CLINICAL DATA:  Metastatic neuroendocrine tumor. Carcinoid tumor of the appendix EXAM: NUCLEAR MEDICINE PET SKULL BASE TO THIGH TECHNIQUE: 3.6 mCi copper 64 DOTATATE was injected intravenously. Full-ring PET imaging was performed from the skull base to thigh after the radiotracer. CT data was obtained and used for attenuation correction and anatomic localization. COMPARISON:  MRI  05/22/2023 FINDINGS: NECK No radiotracer activity in neck lymph nodes. Incidental CT findings: Nodule extension of the LEFT lobe of thyroid gland into the thoracic inlet. CHEST No radiotracer accumulation within mediastinal or hilar lymph nodes. No suspicious pulmonary nodules on the CT scan. Incidental CT finding:None ABDOMEN/PELVIS Two radiotracer avid lesions within the liver. One lesion in the central LEFT hepatic lobe (segment 4A SUV max equal 14.5 on image 124. This lesion measures approximately 1 cm. Lesion in the medial central RIGHT hepatic lobe (segment 6) with SUV max equal 17.7 on image 132. This lesion measures estimation 2 cm. No abnormal radiotracer activity associated with the small bowel or colon. Post partial RIGHT hemicolectomy. No radiotracer avid or enlarged mesenteric lymph nodes. Physiologic activity noted in the liver, spleen, adrenal glands and kidneys. Incidental CT findings:None SKELETON No focal activity to suggest skeletal metastasis. Incidental CT findings:None IMPRESSION: 1. Two radiotracer avid hepatic metastasis consistent well differentiated tumor. 2. No evidence of local recurrence in the bowel. 3. No evidence of metastatic adenopathy in the abdomen pelvis. 4. No evidence of metastatic disease in skeleton. Electronically Signed   By: Genevive Bi M.D.   On: 06/21/2023 14:44    Labs:  CBC: Recent Labs    05/19/23 1759 05/20/23 0355 05/23/23 1046 05/31/23 0935  WBC 8.5 8.6 5.5 7.5  HGB 12.0 11.5* 12.5 12.8  HCT 35.0* 34.3* 35.6* 36.7  PLT 90* 103* 123* 227    COAGS: Recent Labs    05/18/23 2034  INR 1.1  APTT 29    BMP: Recent Labs    05/20/23 0355 05/21/23 0833 05/22/23 0820 05/31/23 0935  NA 136 135 142 140  K 3.9 2.8* 3.4* 4.2  CL 99 101 102 107  CO2 24 27 30 24   GLUCOSE 107* 134* 107* 108*  BUN 16 10 8 15   CALCIUM 8.1* 8.1* 8.4* 9.2  CREATININE 1.05* 0.91 0.88 0.85  GFRNONAA 55* >60 >60 >60    LIVER FUNCTION TESTS: Recent Labs     05/18/23 2033 05/19/23 1759 05/20/23 0355 05/31/23 0935  BILITOT 0.9 0.6 1.0 0.7  AST 20 33 37 19  ALT 16 26 26 21   ALKPHOS 53 46 48 71  PROT 7.0 5.4* 5.5* 7.1  ALBUMIN 4.5 2.8* 3.0* 3.9    TUMOR MARKERS: No results for input(s): "AFPTM", "CEA", "  CA199", "CHROMGRNA" in the last 8760 hours.  Assessment and Plan:  Anna Cortez is a 77 year old female with metastatic neuroendocrine tumor, with lifestyle limiting symptoms of carcinoid syndrome.   Her imaging shows 6 small (all less than ~1cm) lesions of the liver, of which only 2 are identified by PET.    Today we discussed the role of VIR in the setting of palliative treatments for neuroendocrine tumor, specifically in the liver.  Our options include image guided ablation technique (microwave), and trans-arterial embolization (such as y90).  I did describe to her generally, the concepts of both of these, but focused on ablation.    I let her know that my impression that is that we would expect that whole liver (to address all 6 lesions) y90 would produce a ratio of normal liver injury to tumor treatment that would be intolerable at this time.  Thus, ablation, targeting the 2 largest tumors, is the most reasonable way forward in my opinion.    Regarding ablation, I discussed with her the logistics including GETA and possible 23 hr observation, the methods of antenna placement, the goal of tumor treatment with margin of safety, and the risks.  Regarding risks: bleeding, infection, injury to adjacent structures, need for further surgery/treatment, anesthesia complications, tumor tract seeding, cardiopulmonary collapse, death.   Regarding goals of therapy, she understands that this is for palliation, with hope to decrease her symptoms overall, and is not a treatment for cure.   Plan: - Plan for image guided microwave ablation of the 2 largest liver mets for palliative treatment of symptomatic neuroendrine mets/carcinoid syndrome.  Dr. Loreta Ave at  [facility] with GETA.  Patient prefers Friday in mid-late October or after.  - cardiac ECHO to evaluate valves, history of neuroendocrine syndrome - Continue care   Thank you for this interesting consult.  I greatly enjoyed meeting Anna Cortez and look forward to participating in their care.  A copy of this report was sent to the requesting provider on this date.  Electronically Signed: Gilmer Mor 07/09/2023, 8:53 AM   I spent a total of  60 Minutes   in face to face in clinical consultation, greater than 50% of which was counseling/coordinating care for metastatic neuroendocrine tumor, palliative image guided ablation.

## 2023-07-10 ENCOUNTER — Other Ambulatory Visit: Payer: Self-pay | Admitting: Nurse Practitioner

## 2023-07-10 DIAGNOSIS — Z01818 Encounter for other preprocedural examination: Secondary | ICD-10-CM

## 2023-07-10 DIAGNOSIS — C7A8 Other malignant neuroendocrine tumors: Secondary | ICD-10-CM

## 2023-07-19 ENCOUNTER — Other Ambulatory Visit (HOSPITAL_COMMUNITY): Payer: Self-pay

## 2023-07-19 ENCOUNTER — Other Ambulatory Visit: Payer: Self-pay

## 2023-07-19 ENCOUNTER — Other Ambulatory Visit: Payer: Self-pay | Admitting: Hematology

## 2023-07-19 DIAGNOSIS — C7B00 Secondary carcinoid tumors, unspecified site: Secondary | ICD-10-CM

## 2023-07-19 MED ORDER — XERMELO 250 MG PO TABS
1.0000 | ORAL_TABLET | Freq: Three times a day (TID) | ORAL | 0 refills | Status: DC
  Filled 2023-07-19: qty 84, 28d supply, fill #0

## 2023-07-26 ENCOUNTER — Other Ambulatory Visit (HOSPITAL_COMMUNITY): Payer: Self-pay

## 2023-07-31 ENCOUNTER — Other Ambulatory Visit: Payer: Self-pay

## 2023-08-09 ENCOUNTER — Ambulatory Visit: Payer: BC Managed Care – PPO | Admitting: Nurse Practitioner

## 2023-08-09 ENCOUNTER — Encounter: Payer: Self-pay | Admitting: Nurse Practitioner

## 2023-08-09 VITALS — BP 138/72 | HR 63 | Temp 97.9°F | Ht 69.0 in | Wt 180.0 lb

## 2023-08-09 DIAGNOSIS — E663 Overweight: Secondary | ICD-10-CM | POA: Diagnosis not present

## 2023-08-09 DIAGNOSIS — R5383 Other fatigue: Secondary | ICD-10-CM | POA: Diagnosis not present

## 2023-08-09 LAB — IRON: Iron: 162 ug/dL — ABNORMAL HIGH (ref 42–145)

## 2023-08-09 LAB — CBC
HCT: 40.9 % (ref 36.0–46.0)
Hemoglobin: 13.6 g/dL (ref 12.0–15.0)
MCHC: 33.1 g/dL (ref 30.0–36.0)
MCV: 99.4 fL (ref 78.0–100.0)
Platelets: 194 10*3/uL (ref 150.0–400.0)
RBC: 4.12 Mil/uL (ref 3.87–5.11)
RDW: 13.8 % (ref 11.5–15.5)
WBC: 5.4 10*3/uL (ref 4.0–10.5)

## 2023-08-09 LAB — HEMOGLOBIN A1C: Hgb A1c MFr Bld: 6.2 % (ref 4.6–6.5)

## 2023-08-09 LAB — LIPID PANEL
Cholesterol: 233 mg/dL — ABNORMAL HIGH (ref 0–200)
HDL: 61.6 mg/dL (ref >39.00)
LDL Cholesterol: 122 mg/dL — ABNORMAL HIGH (ref 0–99)
NonHDL: 171.71
Total CHOL/HDL Ratio: 4
Triglycerides: 249 mg/dL — ABNORMAL HIGH (ref 0.0–149.0)
VLDL: 49.8 mg/dL — ABNORMAL HIGH (ref 0.0–40.0)

## 2023-08-09 LAB — FERRITIN: Ferritin: 327.2 ng/mL — ABNORMAL HIGH (ref 10.0–291.0)

## 2023-08-09 LAB — T3, FREE: T3, Free: 3.1 pg/mL (ref 2.3–4.2)

## 2023-08-09 LAB — VITAMIN B12: Vitamin B-12: 286 pg/mL (ref 211–911)

## 2023-08-09 LAB — TSH: TSH: 1.1 u[IU]/mL (ref 0.35–5.50)

## 2023-08-09 LAB — VITAMIN D 25 HYDROXY (VIT D DEFICIENCY, FRACTURES): VITD: 37.18 ng/mL (ref 30.00–100.00)

## 2023-08-09 LAB — T4, FREE: Free T4: 0.6 ng/dL (ref 0.60–1.60)

## 2023-08-09 NOTE — Assessment & Plan Note (Signed)
Labs ordered, further recommendations may be made based upon these results. 

## 2023-08-09 NOTE — Progress Notes (Signed)
New Patient Office Visit  Subjective    Patient ID: Anna Cortez, female    DOB: 1945-12-14  Age: 77 y.o. MRN: [id number]  CC:  Chief Complaint  Patient presents with   Obesity    HPI Anna Cortez presents to establish care Her main concerns today include fatigue and being overweight. She has a history of neuroendocrine cancer that has metastasized.  She is currently being treated by oncology.  She reports that this has resulted in difficulty maintaining a normal BMI.  She reports that she would like to discuss options for weight loss.  She was reports that she has been quite fatigued.  This has been bothering her since discharge from hospital few months ago which she was admitted for viral meningitis.  She declines flu and pneumonia vaccines today.   Outpatient Encounter Medications as of 08/09/2023  Medication Sig   diphenoxylate-atropine (LOMOTIL) 2.5-0.025 MG tablet Take 1-2 tablets by mouth 4 (four) times daily as needed for diarrhea or loose stools.   Telotristat Ethyl,as Etiprate, (XERMELO) 250 MG TABS Take 1 tablet (250 mg total) by mouth 3 (three) times daily. Take with food.   acetaminophen (TYLENOL) 325 MG tablet Take 2 tablets (650 mg total) by mouth every 6 (six) hours as needed for mild pain (or Fever >/= 101). (Patient not taking: Reported on 08/09/2023)   aspirin-acetaminophen-caffeine (EXCEDRIN MIGRAINE) 250-250-65 MG tablet Take 1 tablet by mouth every 6 (six) hours as needed for headache or migraine. (Patient not taking: Reported on 05/31/2023)   No facility-administered encounter medications on file as of 08/09/2023.    Past Medical History:  Diagnosis Date   Degenerative disc disease    Insomnia    Neuroendocrine cancer (HCC)    Osteoarthritis     Past Surgical History:  Procedure Laterality Date   IR RADIOLOGIST EVAL & MGMT  07/09/2023   KNEE SURGERY     Right Knee   TUBAL LIGATION     WISDOM TOOTH EXTRACTION      Family History  Problem  Relation Age of Onset   Heart disease Neg Hx    Prostate cancer Father    Colon cancer Neg Hx    Breast cancer Neg Hx    Hypertension Neg Hx    Diabetes Neg Hx    Hyperlipidemia Neg Hx     Social History   Socioeconomic History   Marital status: Single    Spouse name: Not on file   Number of children: Not on file   Years of education: Not on file   Highest education level: Some college, no degree  Occupational History   Not on file  Tobacco Use   Smoking status: Never   Smokeless tobacco: Never  Substance and Sexual Activity   Alcohol use: Yes    Comment: beer   Drug use: No   Sexual activity: Never  Other Topics Concern   Not on file  Social History Narrative   Not on file   Social Determinants of Health   Financial Resource Strain: Patient Declined (08/08/2023)   Overall Financial Resource Strain (CARDIA)    Difficulty of Paying Living Expenses: Patient declined  Food Insecurity: Patient Declined (08/08/2023)   Hunger Vital Sign    Worried About Running Out of Food in the Last Year: Patient declined    Ran Out of Food in the Last Year: Patient declined  Transportation Needs: No Transportation Needs (08/08/2023)   PRAPARE - Administrator, Civil Service (  Medical): No    Lack of Transportation (Non-Medical): No  Physical Activity: Unknown (08/08/2023)   Exercise Vital Sign    Days of Exercise per Week: Patient declined    Minutes of Exercise per Session: Not on file  Stress: Stress Concern Present (08/08/2023)   [employer] of Occupational Health - Occupational Stress Questionnaire    Feeling of Stress : To some extent  Social Connections: Unknown (08/08/2023)   Social Connection and Isolation Panel [NHANES]    Frequency of Communication with Friends and Family: More than three times a week    Frequency of Social Gatherings with Friends and Family: Once a week    Attends Religious Services: Patient declined    Database administrator or  Organizations: Not on file    Attends Banker Meetings: Not on file    Marital Status: Never married  Intimate Partner Violence: Not At Risk (05/22/2023)   Humiliation, Afraid, Rape, and Kick questionnaire    Fear of Current or Ex-Partner: No    Emotionally Abused: No    Physically Abused: No    Sexually Abused: No    ROS: see HPI      Objective    BP 138/72   Pulse 63   Temp 97.9 F (36.6 C) (Temporal)   Ht 5\' 9"  (1.753 m)   Wt 180 lb (81.6 kg)   SpO2 97%   BMI 26.58 kg/m   Physical Exam Vitals reviewed.  Constitutional:      General: She is not in acute distress.    Appearance: Normal appearance.  HENT:     Head: Normocephalic and atraumatic.  Neck:     Vascular: No carotid bruit.  Cardiovascular:     Rate and Rhythm: Normal rate and regular rhythm.     Pulses: Normal pulses.     Heart sounds: Normal heart sounds.  Pulmonary:     Effort: Pulmonary effort is normal.     Breath sounds: Normal breath sounds.  Skin:    General: Skin is warm and dry.  Neurological:     General: No focal deficit present.     Mental Status: She is alert and oriented to person, place, and time.  Psychiatric:        Mood and Affect: Mood normal.        Behavior: Behavior normal.        Judgment: Judgment normal.         Assessment & Plan:   Problem List Items Addressed This Visit       Other   Overweight - Primary    Labs ordered, further recommendations may be made based upon these results  Will also refer patient to nutritionist for assistance with evaluating and assisting her to come up with a diet plan to help with maintaining a healthy weight.      Relevant Orders   Hemoglobin A1c   Lipid panel   TSH   T3, free   T4, free   Amb ref to Medical Nutrition Therapy-MNT   Fatigue    Labs ordered, further recommendations may be made based upon these results       Relevant Orders   VITAMIN D 25 Hydroxy (Vit-D Deficiency, Fractures)   Vitamin B12    Iron   Ferritin   CBC   Amb ref to Medical Nutrition Therapy-MNT    Return in about 3 months (around 11/09/2023) for F/U with Sukari Grist - 40 min appt.   Elenore Paddy, NP

## 2023-08-09 NOTE — Assessment & Plan Note (Signed)
Labs ordered, further recommendations may be made based upon these results  Will also refer patient to nutritionist for assistance with evaluating and assisting her to come up with a diet plan to help with maintaining a healthy weight.

## 2023-08-16 ENCOUNTER — Encounter: Payer: Self-pay | Admitting: Nurse Practitioner

## 2023-08-22 ENCOUNTER — Other Ambulatory Visit: Payer: Self-pay

## 2023-08-22 ENCOUNTER — Other Ambulatory Visit: Payer: Self-pay | Admitting: Hematology

## 2023-08-22 DIAGNOSIS — C7B00 Secondary carcinoid tumors, unspecified site: Secondary | ICD-10-CM

## 2023-08-22 MED ORDER — XERMELO 250 MG PO TABS
1.0000 | ORAL_TABLET | Freq: Three times a day (TID) | ORAL | 0 refills | Status: DC
  Filled 2023-08-22: qty 84, 28d supply, fill #0

## 2023-08-22 NOTE — Progress Notes (Signed)
Specialty Pharmacy Refill Coordination Note  Anna Cortez is a 77 y.o. female contacted today regarding refills of specialty medication(s) Telotristat Etiprate   Patient requested Delivery   Delivery date: 08/28/23   Verified address: [address] [place], [address]   Medication will be filled on 08/27/23.     Pending refill request.

## 2023-08-23 ENCOUNTER — Other Ambulatory Visit (HOSPITAL_COMMUNITY): Payer: Self-pay

## 2023-08-27 ENCOUNTER — Other Ambulatory Visit: Payer: Self-pay

## 2023-08-27 ENCOUNTER — Other Ambulatory Visit (HOSPITAL_COMMUNITY): Payer: Self-pay

## 2023-09-11 ENCOUNTER — Other Ambulatory Visit (HOSPITAL_COMMUNITY): Payer: Self-pay

## 2023-09-13 ENCOUNTER — Other Ambulatory Visit (HOSPITAL_COMMUNITY): Payer: Self-pay

## 2023-09-14 ENCOUNTER — Other Ambulatory Visit (HOSPITAL_COMMUNITY): Payer: Self-pay | Admitting: Interventional Radiology

## 2023-09-14 DIAGNOSIS — D3A8 Other benign neuroendocrine tumors: Secondary | ICD-10-CM

## 2023-09-19 ENCOUNTER — Other Ambulatory Visit: Payer: Self-pay

## 2023-09-21 ENCOUNTER — Other Ambulatory Visit: Payer: Self-pay

## 2023-09-23 ENCOUNTER — Other Ambulatory Visit: Payer: Self-pay | Admitting: Nurse Practitioner

## 2023-09-23 DIAGNOSIS — C7B00 Secondary carcinoid tumors, unspecified site: Secondary | ICD-10-CM

## 2023-09-23 NOTE — Progress Notes (Deleted)
Patient Care Team: Elenore Paddy, NP as PCP - General (Nurse Practitioner) Malachy Mood, MD as Consulting Physician (Hematology and Oncology) Pollyann Samples, NP as Nurse Practitioner (Hematology and Oncology) Carlean Jews, NP as Nurse Practitioner (Hematology and Oncology)   CHIEF COMPLAINT: Follow up metastatic NET  Oncology History  Metastatic carcinoid tumor [facility])  11/2012 Initial Diagnosis   Right hemicolectomy and bilateral salpingo-oophorectomy: 3 cm grade 2 well-differentiated neuroendocrine tumor lymphovascular invasion and perineural invasion present, 6/17 lymph nodes positive, 3 mitosis per 10 high-power fields Ki-67 2 to 5% appendix positive, small bowel mesenteric nodule positive, right and left ovaries positive T4 N1 M1   03/2014 Imaging   Liver metastases   04/2016 Imaging   Dotatate PET: Left subdiaphragmatic lesion 1 cm   04/2016 Miscellaneous   Sandostatin LAR continued till October 2019 and then she was lost to follow-up   08/07/2017 Imaging   Redemonstration of multiple dotatate avid lesions in the liver suggestive of residual neuroendocrine metastases uptake has decreased   08/23/2017 Miscellaneous   Until a 02/14/2018 4 doses of Lutathera given, Sandostatin continued (all the above care was done at 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 Last seen by me 06/28/23 and was referred to IR.   ROS   Past Medical History:  Diagnosis Date   Degenerative disc disease    Insomnia    Neuroendocrine cancer (HCC)    Osteoarthritis      Past Surgical History:  Procedure Laterality Date   IR RADIOLOGIST EVAL & MGMT  07/09/2023   KNEE SURGERY     Right Knee   TUBAL LIGATION     WISDOM TOOTH EXTRACTION       Outpatient Encounter Medications as of 09/26/2023  Medication Sig   acetaminophen (TYLENOL) 325 MG tablet Take 2 tablets (650 mg total) by mouth every 6 (six) hours as needed for mild pain (or Fever >/= 101). (Patient not taking: Reported on 08/09/2023)   aspirin-acetaminophen-caffeine (EXCEDRIN MIGRAINE) 250-250-65 MG tablet Take 1 tablet by mouth every 6 (six) hours as needed for headache or migraine. (Patient not taking: Reported on 05/31/2023)   diphenoxylate-atropine (LOMOTIL) 2.5-0.025 MG tablet Take 1-2 tablets by mouth 4 (four) times daily as needed for diarrhea or loose stools. (Patient not taking: Reported on 09/19/2023)   Telotristat Ethyl,as Etiprate, (XERMELO) 250 MG TABS Take 1 tablet (250 mg total) by mouth 3 (three) times daily. Take with food.   No facility-administered encounter medications on file as of 09/26/2023.     There were no vitals filed for this visit. There is no height or weight on file to calculate BMI.   PHYSICAL EXAM GENERAL:alert, no distress and comfortable SKIN: no rash  EYES: sclera clear NECK: without mass LYMPH:  no palpable cervical or supraclavicular lymphadenopathy  LUNGS: clear with normal breathing effort HEART: regular rate & rhythm, no  lower extremity edema ABDOMEN: abdomen soft, non-tender and normal bowel sounds NEURO: alert & oriented x 3 with fluent speech, no focal motor/sensory deficits Breast exam:  PAC without erythema    CBC    Component Value Date/Time   WBC 5.4 08/09/2023 1124   RBC 4.12 08/09/2023 1124   HGB 13.6 08/09/2023  1124   HGB 12.8 05/31/2023 0935   HCT 40.9 08/09/2023 1124   PLT 194.0 08/09/2023 1124   PLT 227 05/31/2023 0935   MCV 99.4 08/09/2023 1124   MCH 34.0 05/31/2023 0935   MCHC 33.1 08/09/2023 1124   RDW 13.8 08/09/2023 1124   LYMPHSABS 1.4 05/31/2023 0935   MONOABS 0.6 05/31/2023 0935   EOSABS 0.1 05/31/2023 0935   BASOSABS 0.1 05/31/2023 0935     CMP     Component Value Date/Time   NA 140 05/31/2023 0935   K 4.2 05/31/2023 0935   CL 107 05/31/2023 0935   CO2 24 05/31/2023 0935   GLUCOSE 108 (H) 05/31/2023 0935   BUN 15 05/31/2023 0935   CREATININE 0.85 05/31/2023 0935   CREATININE 0.93 12/22/2011 1145   CALCIUM 9.2 05/31/2023 0935   PROT 7.1 05/31/2023 0935   ALBUMIN 3.9 05/31/2023 0935   AST 19 05/31/2023 0935   ALT 21 05/31/2023 0935   ALKPHOS 71 05/31/2023 0935   BILITOT 0.7 05/31/2023 0935   GFRNONAA >60 05/31/2023 0935     ASSESSMENT & PLAN:77 year old female   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 Flonnie Hailstone at [facility]; - Path showed well-differentiated NET of the small bowel and appendix, with metastatic mesenteric nodule and bilateral ovarian involvement, stage pT4, N1, M1. Went on observation  -04/2014 liver metastasis on MRI, showing multiple lesions suspicious for metastatic NET.  Symptoms of flushing and diarrhea were present. Continued observation until carcinoid syndrome worsened  -04/2016 began first-line octreotide 20 mg, lost f/up 2018 - 07/2017 off sandostatin. Sx worsened  -S/p Lutathera x4 (08/23/17, 10/18/17, 2/29/19, and 02/14/18 + sandostatin) and continued sandostatin.   -Relocated to [place] 11/2018, [facility] where she continued monthly Sandostatin - 01/13/2021, d/c'd per pt due to lack of efficacy  -2nd opinion at [facility], continued supportive care -Began lanreotide 08/2021  and retreated with Lutathera 08/2021 - 02/10/22 x 3 doses; dose held due to poor tolerance (fatigue and nausea).  -Last lanreotide 01/2022 -Hospitalized 04/2023 for viral meningitis, recovered well -05/31/23: Chromogranin A and 5HIAA urine normal, started Xermelo for diarrhea -06/28/23: Anna Cortez appears well. Weight is stable. Diarrhea slightly improved on Xermelo, I recommend to add lomotil for additional results.  -We reviewed dotatate PET scan which shows 2 liver hypermetabolic liver lesions c/w metastatic NET. Still waiting on images from [facility] (disc has been requested), but per the report from last PET scan in 2022 this is improvement.  -However, she is symptomatic with carcinoid syndrome diarrhea.  -Referred to IR   Diarrhea, flushing, abdominal pain -Secondary to carcinoid syndrome #1 -She has tried Imodium without efficacy, side notes suggest she has failed Questran as well.   -05/31/23: began [name], partially effective -06/28/23: I recommend to add lomotil PRN   Social -She lives independently but is very close with her sister who is involved in her care     PLAN:  No orders of the defined types were placed in this encounter.     All questions were answered. The  patient knows to call the clinic with any problems, questions or concerns. No barriers to learning were detected. I spent *** counseling the patient face to face. The total time spent in the appointment was *** and more than 50% was on counseling, review of test results, and coordination of care.   Santiago Glad, NP-C @DATE @

## 2023-09-24 ENCOUNTER — Other Ambulatory Visit: Payer: Self-pay | Admitting: Physician Assistant

## 2023-09-24 DIAGNOSIS — Z01818 Encounter for other preprocedural examination: Secondary | ICD-10-CM

## 2023-09-24 NOTE — Progress Notes (Signed)
Called P.A. Line in IR and requested pre op orders.

## 2023-09-25 ENCOUNTER — Other Ambulatory Visit: Payer: Self-pay

## 2023-09-25 ENCOUNTER — Other Ambulatory Visit: Payer: Self-pay | Admitting: Hematology

## 2023-09-25 DIAGNOSIS — C7B00 Secondary carcinoid tumors, unspecified site: Secondary | ICD-10-CM

## 2023-09-25 MED ORDER — XERMELO 250 MG PO TABS
1.0000 | ORAL_TABLET | Freq: Three times a day (TID) | ORAL | 0 refills | Status: DC
  Filled 2023-09-25: qty 84, 28d supply, fill #0

## 2023-09-25 NOTE — Progress Notes (Signed)
Specialty Pharmacy Refill Coordination Note  Anna Cortez is a 77 y.o. female contacted today regarding refills of specialty medication(s) Telotristat Etiprate   Patient requested Delivery   Delivery date: 10/03/23   Verified address: [address] [place], [address]   Medication will be filled on 10/02/23.

## 2023-09-25 NOTE — Progress Notes (Signed)
Pending Refill Request completed.

## 2023-09-25 NOTE — Progress Notes (Signed)
Specialty Pharmacy Ongoing Clinical Assessment Note  Anna Cortez is a 78 y.o. female who is being followed by the specialty pharmacy service for RxSp Oncology   Patient's specialty medication(s) reviewed today: Telotristat Etiprate   Missed doses in the last 4 weeks: 3   Patient/Caregiver did not have any additional questions or concerns.   Therapeutic benefit summary: Patient is achieving benefit   Adverse events/side effects summary: No adverse events/side effects   Patient's therapy is appropriate to: Continue    Goals Addressed             This Visit's Progress    Improve or maintain quality of life       Patient is on track. Patient will maintain adherence         Follow up:  6 months  Otto Herb Specialty Pharmacist

## 2023-09-25 NOTE — Patient Instructions (Signed)
DUE TO COVID-19 ONLY TWO VISITORS  (aged 75 and older)  ARE ALLOWED TO COME WITH YOU AND STAY IN THE WAITING ROOM ONLY DURING PRE OP AND PROCEDURE.   **NO VISITORS ARE ALLOWED IN THE SHORT STAY AREA OR RECOVERY ROOM!!**  IF YOU WILL BE ADMITTED INTO THE HOSPITAL YOU ARE ALLOWED ONLY FOUR SUPPORT PEOPLE DURING VISITATION HOURS ONLY (7 AM -8PM)   The support person(s) must pass our screening, gel in and out, and wear a mask at all times, including in the patient's room. Patients must also wear a mask when staff or their support person are in the room. Visitors GUEST BADGE MUST BE WORN VISIBLY  One adult visitor may remain with you overnight and MUST be in the room by 8 P.M.     Your procedure is scheduled on: 10/10/23   Report to [facility] Main Entrance    Report to admitting at : 6:15 AM   Call this number if you have problems the morning of surgery [phone]   Do not eat food or drink fluids: After Midnight.  FOLLOW ANY ADDITIONAL PRE OP INSTRUCTIONS YOU RECEIVED FROM YOUR SURGEON'S OFFICE!!!   Oral Hygiene is also important to reduce your risk of infection.                                    Remember - BRUSH YOUR TEETH THE MORNING OF SURGERY WITH YOUR REGULAR TOOTHPASTE  DENTURES WILL BE REMOVED PRIOR TO SURGERY PLEASE DO NOT APPLY "Poly grip" OR ADHESIVES!!!   Do NOT smoke after Midnight   Take these medicines the morning of surgery with A SIP OF WATER: NONE.  DO NOT TAKE ANY ORAL DIABETIC MEDICATIONS DAY OF YOUR SURGERY  Bring CPAP mask and tubing day of surgery.                              You may not have any metal on your body including hair pins, jewelry, and body piercing             Do not wear make-up, lotions, powders, perfumes/cologne, or deodorant  Do not wear nail polish including gel and S&S, artificial/acrylic nails, or any other type of covering on natural nails including finger and toenails. If you have artificial nails, gel coating, etc. that  needs to be removed by a nail salon please have this removed prior to surgery or surgery may need to be canceled/ delayed if the surgeon/ anesthesia feels like they are unable to be safely monitored.   Do not shave  48 hours prior to surgery.    Do not bring valuables to the hospital. [facility] IS NOT             RESPONSIBLE   FOR VALUABLES.   Contacts, glasses, or bridgework may not be worn into surgery.   Bring small overnight bag day of surgery.   DO NOT BRING YOUR HOME MEDICATIONS TO THE HOSPITAL. PHARMACY WILL DISPENSE MEDICATIONS LISTED ON YOUR MEDICATION LIST TO YOU DURING YOUR ADMISSION IN THE HOSPITAL!    Patients discharged on the day of surgery will not be allowed to drive home.  Someone NEEDS to stay with you for the first 24 hours after anesthesia.   Special Instructions: Bring a copy of your healthcare power of attorney and living will documents  the day of surgery if you haven't scanned them before.              Please read over the following fact sheets you were given: IF YOU HAVE QUESTIONS ABOUT YOUR PRE-OP INSTRUCTIONS PLEASE CALL [phone]    [facility] - Preparing for Surgery Before surgery, you can play an important role.  Because skin is not sterile, your skin needs to be as free of germs as possible.  You can reduce the number of germs on your skin by washing with CHG (chlorahexidine gluconate) soap before surgery.  CHG is an antiseptic cleaner which kills germs and bonds with the skin to continue killing germs even after washing. Please DO NOT use if you have an allergy to CHG or antibacterial soaps.  If your skin becomes reddened/irritated stop using the CHG and inform your nurse when you arrive at Short Stay. Do not shave (including legs and underarms) for at least 48 hours prior to the first CHG shower.  You may shave your face/neck. Please follow these instructions carefully:  1.  Shower with CHG Soap the night before surgery and the  morning of  Surgery.  2.  If you choose to wash your hair, wash your hair first as usual with your  normal  shampoo.  3.  After you shampoo, rinse your hair and body thoroughly to remove the  shampoo.                           4.  Use CHG as you would any other liquid soap.  You can apply chg directly  to the skin and wash                       Gently with a scrungie or clean washcloth.  5.  Apply the CHG Soap to your body ONLY FROM THE NECK DOWN.   Do not use on face/ open                           Wound or open sores. Avoid contact with eyes, ears mouth and genitals (private parts).                       Wash face,  Genitals (private parts) with your normal soap.             6.  Wash thoroughly, paying special attention to the area where your surgery  will be performed.  7.  Thoroughly rinse your body with warm water from the neck down.  8.  DO NOT shower/wash with your normal soap after using and rinsing off  the CHG Soap.                9.  Pat yourself dry with a clean towel.            10.  Wear clean pajamas.            11.  Place clean sheets on your bed the night of your first shower and do not  sleep with pets. Day of Surgery : Do not apply any lotions/deodorants the morning of surgery.  Please wear clean clothes to the hospital/surgery center.  FAILURE TO FOLLOW THESE INSTRUCTIONS MAY RESULT IN THE CANCELLATION OF YOUR SURGERY PATIENT SIGNATURE_________________________________  NURSE SIGNATURE__________________________________  ________________________________________________________________________

## 2023-09-26 ENCOUNTER — Inpatient Hospital Stay: Payer: BC Managed Care – PPO | Admitting: Nurse Practitioner

## 2023-09-26 ENCOUNTER — Encounter (HOSPITAL_COMMUNITY)
Admission: RE | Admit: 2023-09-26 | Discharge: 2023-09-26 | Disposition: A | Discharge status: Home / Self Care | Payer: BC Managed Care – PPO | Source: Ambulatory Visit | Attending: Interventional Radiology | Admitting: Interventional Radiology

## 2023-09-26 ENCOUNTER — Encounter (HOSPITAL_COMMUNITY): Payer: Self-pay

## 2023-09-26 ENCOUNTER — Other Ambulatory Visit: Payer: Self-pay | Admitting: Nurse Practitioner

## 2023-09-26 ENCOUNTER — Other Ambulatory Visit: Payer: Self-pay

## 2023-09-26 ENCOUNTER — Inpatient Hospital Stay: Payer: BC Managed Care – PPO | Attending: Nurse Practitioner

## 2023-09-26 ENCOUNTER — Telehealth: Payer: Self-pay | Admitting: Nurse Practitioner

## 2023-09-26 ENCOUNTER — Ambulatory Visit (INDEPENDENT_AMBULATORY_CARE_PROVIDER_SITE_OTHER): Payer: BC Managed Care – PPO

## 2023-09-26 DIAGNOSIS — Z01810 Encounter for preprocedural cardiovascular examination: Secondary | ICD-10-CM

## 2023-09-26 DIAGNOSIS — Z01812 Encounter for preprocedural laboratory examination: Secondary | ICD-10-CM | POA: Insufficient documentation

## 2023-09-26 DIAGNOSIS — C7B00 Secondary carcinoid tumors, unspecified site: Secondary | ICD-10-CM

## 2023-09-26 DIAGNOSIS — Z01818 Encounter for other preprocedural examination: Secondary | ICD-10-CM

## 2023-09-26 DIAGNOSIS — C7B8 Other secondary neuroendocrine tumors: Secondary | ICD-10-CM

## 2023-09-26 DIAGNOSIS — C7B02 Secondary carcinoid tumors of liver: Secondary | ICD-10-CM | POA: Insufficient documentation

## 2023-09-26 DIAGNOSIS — C7A8 Other malignant neuroendocrine tumors: Secondary | ICD-10-CM

## 2023-09-26 DIAGNOSIS — C7A022 Malignant carcinoid tumor of the ascending colon: Secondary | ICD-10-CM | POA: Insufficient documentation

## 2023-09-26 HISTORY — DX: Depression, unspecified: F32.A

## 2023-09-26 HISTORY — DX: Anxiety disorder, unspecified: F41.9

## 2023-09-26 LAB — PROTIME-INR
INR: 1 (ref 0.8–1.2)
Prothrombin Time: 13.9 s (ref 11.4–15.2)

## 2023-09-26 LAB — COMPREHENSIVE METABOLIC PANEL
ALT: 23 U/L (ref 0–44)
AST: 23 U/L (ref 15–41)
Albumin: 4.1 g/dL (ref 3.5–5.0)
Alkaline Phosphatase: 77 U/L (ref 38–126)
Anion gap: 9 (ref 5–15)
BUN: 22 mg/dL (ref 8–23)
CO2: 24 mmol/L (ref 22–32)
Calcium: 9.4 mg/dL (ref 8.9–10.3)
Chloride: 104 mmol/L (ref 98–111)
Creatinine, Ser: 0.78 mg/dL (ref 0.44–1.00)
GFR, Estimated: >60 mL/min (ref >60)
Glucose, Bld: 103 mg/dL — ABNORMAL HIGH (ref 70–99)
Potassium: 4.2 mmol/L (ref 3.5–5.1)
Sodium: 137 mmol/L (ref 135–145)
Total Bilirubin: 0.8 mg/dL (ref <1.2)
Total Protein: 7.4 g/dL (ref 6.5–8.1)

## 2023-09-26 LAB — CBC
HCT: 38.3 % (ref 36.0–46.0)
Hemoglobin: 13.2 g/dL (ref 12.0–15.0)
MCH: 34.1 pg — ABNORMAL HIGH (ref 26.0–34.0)
MCHC: 34.5 g/dL (ref 30.0–36.0)
MCV: 99 fL (ref 80.0–100.0)
Platelets: 173 10*3/uL (ref 150–400)
RBC: 3.87 MIL/uL (ref 3.87–5.11)
RDW: 12.9 % (ref 11.5–15.5)
WBC: 5.2 10*3/uL (ref 4.0–10.5)
nRBC: 0 % (ref 0.0–0.2)

## 2023-09-26 LAB — TYPE AND SCREEN
ABO/RH(D): O POS
Antibody Screen: NEGATIVE

## 2023-09-26 LAB — ECHOCARDIOGRAM COMPLETE
Area-P 1/2: 3.85 cm2
Height: 69 in
P 1/2 time: 1076 ms
S' Lateral: 2.45 cm
Weight: 2896 [oz_av]

## 2023-09-26 NOTE — Telephone Encounter (Signed)
I called Anna Cortez this morning regarding her appointments. She has ablation scheduled 10/10/23. She is doing well overall, xermelo helping diarrhea. Takes it 2-3 times per day occasionally forgets lunchtime dose. Hasn't needed lomotil lately. Denies pain or other new concerns.   She will come for chromogranin A today, then proceed with pre admission testing. Will r/s her f/up to January approx 4-6 weeks after procedure. She agrees with the plan and has no other needs at this time.   Santiago Glad, NP

## 2023-09-26 NOTE — Progress Notes (Signed)
For Anesthesia: PCP - Elenore Paddy, NP  Cardiologist - N/A  Bowel Prep reminder:  Chest x-ray -05/18/23  EKG - 05/22/23 Stress Test -  ECHO -  Cardiac Cath -  Pacemaker/ICD device last checked: Pacemaker orders received: Device Rep notified:  Spinal Cord Stimulator:  Sleep Study -  CPAP -   Fasting Blood Sugar -  Checks Blood Sugar _____ times a day Date and result of last Hgb A1c-6.2: 08/09/23  Last dose of GLP1 agonist- N/A GLP1 instructions:   Last dose of SGLT-2 inhibitors- N/A SGLT-2 instructions:   Blood Thinner Instructions: N/A Aspirin Instructions: Last Dose:  Activity level: Can go up a flight of stairs and activities of daily living without stopping and without chest pain and/or shortness of breath   Able to exercise without chest pain and/or shortness of breath  Anesthesia review:   Patient denies shortness of breath, fever, cough and chest pain at PAT appointment   Patient verbalized understanding of instructions that were given to them at the PAT appointment. Patient was also instructed that they will need to review over the PAT instructions again at home before surgery.

## 2023-09-27 LAB — CHROMOGRANIN A: Chromogranin A (ng/mL): 62.6 ng/mL (ref 0.0–101.8)

## 2023-10-01 ENCOUNTER — Telehealth: Payer: Self-pay

## 2023-10-01 NOTE — Telephone Encounter (Signed)
-----   Message from Pollyann Samples sent at 09/30/2023 11:12 PM EST ----- Please let pt know chromogranin A is normal. Proceed with ablation and see her back in Jan as scheduled.   Thanks Lacie NP

## 2023-10-01 NOTE — Telephone Encounter (Signed)
Pt advised with VU 

## 2023-10-02 ENCOUNTER — Other Ambulatory Visit (HOSPITAL_COMMUNITY): Payer: Self-pay

## 2023-10-02 ENCOUNTER — Telehealth: Payer: Self-pay | Admitting: Pharmacy Technician

## 2023-10-02 ENCOUNTER — Other Ambulatory Visit: Payer: Self-pay

## 2023-10-02 NOTE — Telephone Encounter (Signed)
Oral Oncology Pharmacist Encounter   Urgent/Expedited appeal letter sent to [facility] [employer] with supporting documentation. Appeal faxed to: [phone].  Oral Oncology Clinic will continue to follow.   Sherry Ruffing, PharmD, BCPS, BCOP Hematology/Oncology Clinical Pharmacist [facility] Oral Chemotherapy Navigation Clinics [phone] 10/02/2023 11:54 AM

## 2023-10-02 NOTE — Telephone Encounter (Signed)
Oral Oncology Patient Advocate Encounter  Received notification that the request for prior authorization for Anna Cortez has been denied due to "Your plan only covers this drug for your health condition, carcinoid syndrome diarrhea, if you will be taking it with somatostatin analog therapy. We have denied your request because you are not (or will not be) taking it. We reviewed the information we had. Your request has been denied."     Jinger Neighbors, CPhT-Adv Oncology Pharmacy Patient Advocate [facility] Direct Number: [phone]  Fax: [phone]

## 2023-10-02 NOTE — Telephone Encounter (Signed)
Oral Oncology Patient Advocate Encounter   Received notification that prior authorization for Anna Cortez is required.   PA submitted on 10/02/23 Key [id number] Status is pending     Anna Cortez, CPhT-Adv Oncology Pharmacy Patient Advocate [facility] Direct Number: [phone]  Fax: [phone]

## 2023-10-03 ENCOUNTER — Other Ambulatory Visit: Payer: Self-pay

## 2023-10-03 ENCOUNTER — Telehealth: Payer: Self-pay | Admitting: Pharmacy Technician

## 2023-10-03 ENCOUNTER — Other Ambulatory Visit (HOSPITAL_COMMUNITY): Payer: Self-pay

## 2023-10-03 NOTE — Telephone Encounter (Signed)
Oral Oncology Patient Advocate Encounter   Began application for assistance for Xermelo through [employer].   Application will be submitted upon completion of necessary supporting documentation.   [employer] phone number [phone].   I will continue to check the status until final determination.   Jinger Neighbors, CPhT-Adv Oncology Pharmacy Patient Advocate [facility] Direct Number: [phone]  Fax: [phone]

## 2023-10-03 NOTE — Telephone Encounter (Signed)
Oral Oncology Patient Advocate Encounter  Received notification that the request for prior authorization for [name] has been denied due to Medication is not being used in combination with somatostatin analog therapy (octreotide or lanreotide).   Jinger Neighbors, CPhT-Adv Oncology Pharmacy Patient Advocate [facility] Direct Number: [phone]  Fax: [phone]

## 2023-10-04 ENCOUNTER — Other Ambulatory Visit (HOSPITAL_COMMUNITY): Payer: Self-pay

## 2023-10-04 ENCOUNTER — Other Ambulatory Visit: Payer: Self-pay | Admitting: Pharmacy Technician

## 2023-10-04 NOTE — Telephone Encounter (Signed)
Oral Oncology Patient Advocate Encounter  Called and spoke with the patient to request signatures for assistance for Xermelo.   After discussing with the patient regarding her insurance change and the denial of Xermelo, patient has opted to wait to sign any PAP forms until a later time. She states she will wait and see if the injection she will be getting will help and also stated she will use "those little white pills", presumably lomotil, as needed.   I confirmed patient has my contact information and encouraged her to reach out if she would like to apply for PAP in the future. She believes, if at all, this will most likely be in January 2025.  Jinger Neighbors, CPhT-Adv Oncology Pharmacy Patient Advocate [facility] Direct Number: [phone]  Fax: [phone]

## 2023-10-04 NOTE — Progress Notes (Signed)
Patient to be removed from [facility] Specialty call list. If they resume Xermelo in the future it will most likely be under PAP.

## 2023-10-05 ENCOUNTER — Other Ambulatory Visit: Payer: Self-pay

## 2023-10-08 ENCOUNTER — Other Ambulatory Visit: Payer: Self-pay | Admitting: Radiology

## 2023-10-09 NOTE — H&P (Signed)
Chief Complaint: Patient was seen in consultation today for CT guided microwave ablation of the 2 large liver lesions for palliative treatment of symptomatic neuroendrine mets/carcinoid syndrome.  Supervising Physician: Gilmer Mor  Patient Status: [facility] - Out-pt  History of Present Illness: Anna Cortez is a 77 y.o. female with a medical history significant for anxiety, depression, chronic kidney disease, HTN and metastatic neuroendocrine cancer with carcinoid syndrome. She was hospitalized earlier this year with viral meningitis.   She was initially diagnosed with neuroendocrine cancer in 2013 and is s/p right hemicolectomy and BSO February 2014. She was found to have liver metastases in 2015 with symptoms of flushing and diarrhea. She has received several rounds of Lutathera, sandostatin and lanreotide treatments.  She met with Dr. Loreta Ave 07/09/23 to discuss possible treatment options for the lifestyle limiting symptoms of carcinoid syndrome. Recent imaging shows 6 small liver lesions two of which are identified by PET. Dr. Loreta Ave discussed several treatment options including microwave ablation and transarterial embolization with Y90. He discussed risks, benefits, expectations and post-procedure care for each and the patient elected to pursue microwave ablation.  Past Medical History:  Diagnosis Date   Anxiety    Degenerative disc disease    Depression    Insomnia    Neuroendocrine cancer (HCC)    Osteoarthritis     Past Surgical History:  Procedure Laterality Date   APPENDECTOMY     IR RADIOLOGIST EVAL & MGMT  07/09/2023   KNEE SURGERY     Right Knee   TONSILLECTOMY     TUBAL LIGATION     WISDOM TOOTH EXTRACTION      Allergies: Astemizole, Penicillins, Prochlorperazine, Prochlorperazine edisylate, and Tolectin [tolmetin sodium]  Medications: Prior to Admission medications   Medication Sig Start Date End Date Taking? Authorizing Provider  acetaminophen (TYLENOL)  325 MG tablet Take 2 tablets (650 mg total) by mouth every 6 (six) hours as needed for mild pain (or Fever >/= 101). Patient not taking: Reported on 08/09/2023 05/24/23   Azucena Fallen, MD  aspirin-acetaminophen-caffeine [facility] MIGRAINE) [phone] MG tablet Take 1 tablet by mouth every 6 (six) hours as needed for headache or migraine. Patient not taking: Reported on 05/31/2023 05/24/23   Azucena Fallen, MD  cyanocobalamin (VITAMIN B12) 1000 MCG tablet Take 1,000 mcg by mouth 3 (three) times a week.    [provider]  diphenoxylate-atropine (LOMOTIL) 2.5-0.025 MG tablet Take 1-2 tablets by mouth 4 (four) times daily as needed for diarrhea or loose stools. Patient not taking: Reported on 09/19/2023 05/31/23   Pollyann Samples, NP  Telotristat Ethyl,as Etiprate, (XERMELO) 250 MG TABS Take 1 tablet (250 mg total) by mouth 3 (three) times daily. Take with food. 09/25/23   Malachy Mood, MD     Family History  Problem Relation Age of Onset   Heart disease Neg Hx    Prostate cancer Father    Colon cancer Neg Hx    Breast cancer Neg Hx    Hypertension Neg Hx    Diabetes Neg Hx    Hyperlipidemia Neg Hx     Social History   Socioeconomic History   Marital status: Single    Spouse name: Not on file   Number of children: Not on file   Years of education: Not on file   Highest education level: Some college, no degree  Occupational History   Not on file  Tobacco Use   Smoking status: Never   Smokeless tobacco: Never  Vaping Use  Vaping status: Never Used  Substance and Sexual Activity   Alcohol use: Yes    Comment: beer   Drug use: No   Sexual activity: Never  Other Topics Concern   Not on file  Social History Narrative   Not on file   Social Determinants of Health   Financial Resource Strain: Patient Declined (08/08/2023)   Overall Financial Resource Strain (CARDIA)    Difficulty of Paying Living Expenses: Patient declined  Food Insecurity: Patient Declined  (08/08/2023)   Hunger Vital Sign    Worried About Running Out of Food in the Last Year: Patient declined    Ran Out of Food in the Last Year: Patient declined  Transportation Needs: No Transportation Needs (08/08/2023)   PRAPARE - Administrator, Civil Service (Medical): No    Lack of Transportation (Non-Medical): No  Physical Activity: Unknown (08/08/2023)   Exercise Vital Sign    Days of Exercise per Week: Patient declined    Minutes of Exercise per Session: Not on file  Stress: Stress Concern Present (08/08/2023)   [employer] of Occupational Health - Occupational Stress Questionnaire    Feeling of Stress : To some extent  Social Connections: Unknown (08/08/2023)   Social Connection and Isolation Panel [NHANES]    Frequency of Communication with Friends and Family: More than three times a week    Frequency of Social Gatherings with Friends and Family: Once a week    Attends Religious Services: Patient declined    Database administrator or Organizations: Not on file    Attends Banker Meetings: Not on file    Marital Status: Never married    Review of Systems: A 12 point ROS discussed and pertinent positives are indicated in the HPI above.  All other systems are negative.  Review of Systems  Constitutional:  Negative for appetite change and fatigue.  Respiratory:  Negative for cough and shortness of breath.   Cardiovascular:  Negative for chest pain and leg swelling.  Gastrointestinal:  Negative for abdominal pain, diarrhea, nausea and vomiting.  Neurological:  Negative for dizziness and headaches.    Vital Signs: BP (!) 148/75   Pulse 74   Temp 98.2 F (36.8 C) (Oral)   Resp 19   SpO2 96%   Physical Exam Constitutional:      General: She is not in acute distress.    Appearance: She is not ill-appearing.  HENT:     Mouth/Throat:     Mouth: Mucous membranes are moist.     Pharynx: Oropharynx is clear.  Cardiovascular:     Rate and Rhythm:  Normal rate.  Pulmonary:     Effort: Pulmonary effort is normal.  Abdominal:     Tenderness: There is no abdominal tenderness.  Skin:    General: Skin is warm and dry.  Neurological:     Mental Status: She is alert and oriented to person, place, and time.  Psychiatric:        Mood and Affect: Mood normal.        Behavior: Behavior normal.        Thought Content: Thought content normal.        Judgment: Judgment normal.     Imaging: ECHOCARDIOGRAM COMPLETE  Result Date: 09/26/2023    ECHOCARDIOGRAM REPORT   Patient Name:   Anna Cortez Date of Exam: 09/26/2023 Medical Rec #:  [id number]         Height:  69.0 in Accession #:    [id number]        Weight:       181.0 lb Date of Birth:  27-Cortez-1947         BSA:          1.980 m Patient Age:    77 years          BP:           130/72 mmHg Patient Gender: F                 HR:           60 bpm. Exam Location:  Outpatient Procedure: 2D Echo, 3D Echo, Cardiac Doppler, Color Doppler and Strain Analysis Indications:    Preoperative evaluation  History:        Patient has no prior history of Echocardiogram examinations.                 Risk Factors:Hypertension, Dyslipidemia and Non-Smoker.                 Metastatic malignant neuroendocrine tumor to liver.  Sonographer:    Jeryl Columbia RDCS Referring Phys: [id number] Gilmer Mor IMPRESSIONS  1. Left ventricular ejection fraction, by estimation, is 60 to 65%. The left ventricle has normal function. The left ventricle has no regional wall motion abnormalities. Left ventricular diastolic parameters are consistent with Grade II diastolic dysfunction (pseudonormalization). The average left ventricular global longitudinal strain is -19.0 %. The global longitudinal strain is normal.  2. Right ventricular systolic function is normal. The right ventricular size is normal. Tricuspid regurgitation signal is inadequate for assessing PA pressure.  3. The mitral valve is normal in structure. No evidence of  mitral valve regurgitation.  4. The aortic valve is grossly normal. Aortic valve regurgitation is trivial.  5. The inferior vena cava is normal in size with greater than 50% respiratory variability, suggesting right atrial pressure of 3 mmHg. Comparison(s): No prior Echocardiogram. Conclusion(s)/Recommendation(s): Normal biventricular function without evidence of hemodynamically significant valvular heart disease. FINDINGS  Left Ventricle: Left ventricular ejection fraction, by estimation, is 60 to 65%. The left ventricle has normal function. The left ventricle has no regional wall motion abnormalities. The average left ventricular global longitudinal strain is -19.0 %. The global longitudinal strain is normal. The left ventricular internal cavity size was normal in size. There is no left ventricular hypertrophy. Left ventricular diastolic parameters are consistent with Grade II diastolic dysfunction (pseudonormalization). Right Ventricle: The right ventricular size is normal. Right ventricular systolic function is normal. Tricuspid regurgitation signal is inadequate for assessing PA pressure. Left Atrium: Left atrial size was normal in size. Right Atrium: Right atrial size was normal in size. Pericardium: There is no evidence of pericardial effusion. Mitral Valve: The mitral valve is normal in structure. No evidence of mitral valve regurgitation. Tricuspid Valve: The tricuspid valve is normal in structure. Tricuspid valve regurgitation is not demonstrated. Aortic Valve: The aortic valve is grossly normal. Aortic valve regurgitation is trivial. Aortic regurgitation PHT measures 1076 msec. Pulmonic Valve: Pulmonic valve regurgitation is not visualized. Aorta: The aortic root and ascending aorta are structurally normal, with no evidence of dilitation. Venous: The inferior vena cava is normal in size with greater than 50% respiratory variability, suggesting right atrial pressure of 3 mmHg. IAS/Shunts: No atrial level  shunt detected by color flow Doppler.  LEFT VENTRICLE PLAX 2D LVIDd:         3.83 cm   Diastology LVIDs:  2.45 cm   LV e' medial:    3.70 cm/s LV PW:         1.15 cm   LV E/e' medial:  20.2 LV IVS:        0.90 cm   LV e' lateral:   6.53 cm/s LVOT diam:     1.90 cm   LV E/e' lateral: 11.5 LV SV:         84 LV SV Index:   43        2D Longitudinal Strain LVOT Area:     2.84 cm  2D Strain GLS Avg:     -19.0 %                           3D Volume EF:                          3D EF:        61 %                          LV EDV:       134 ml                          LV ESV:       52 ml                          LV SV:        82 ml RIGHT VENTRICLE RV Basal diam:  3.48 cm RV Mid diam:    2.56 cm RV S prime:     12.10 cm/s TAPSE (M-mode): 2.1 cm LEFT ATRIUM             Index        RIGHT ATRIUM           Index LA diam:        2.70 cm 1.36 cm/m   RA Area:     12.00 cm LA Vol (A2C):   42.9 ml 21.66 ml/m  RA Volume:   26.60 ml  13.43 ml/m LA Vol (A4C):   27.5 ml 13.89 ml/m LA Biplane Vol: 34.7 ml 17.52 ml/m  AORTIC VALVE LVOT Vmax:   150.00 cm/s LVOT Vmean:  98.500 cm/s LVOT VTI:    0.298 m AI PHT:      1076 msec  AORTA Ao Root diam: 2.80 cm Ao Asc diam:  3.20 cm MITRAL VALVE MV Area (PHT): 3.85 cm    SHUNTS MV Decel Time: 197 msec    Systemic VTI:  0.30 m MV E velocity: 74.90 cm/s  Systemic Diam: 1.90 cm MV A velocity: 74.90 cm/s MV E/A ratio:  1.00 Carolan Clines Electronically signed by Carolan Clines Signature Date/Time: 09/26/2023/5:25:54 PM    Final     Labs:  CBC: Recent Labs    05/23/23 1046 05/31/23 0935 08/09/23 1124 09/26/23 1039  WBC 5.5 7.5 5.4 5.2  HGB 12.5 12.8 13.6 13.2  HCT 35.6* 36.7 40.9 38.3  PLT 123* 227 194.0 173    COAGS: Recent Labs    05/18/23 2034 09/26/23 1039  INR 1.1 1.0  APTT 29  --     BMP: Recent Labs    05/21/23 0833 05/22/23 0820 05/31/23 0935 09/26/23 1039  NA 135 142 140 137  K 2.8* 3.4*  4.2 4.2  CL 101 102 107 104  CO2 27 30 24 24   GLUCOSE 134* 107*  108* 103*  BUN 10 8 15 22   CALCIUM 8.1* 8.4* 9.2 9.4  CREATININE 0.91 0.88 0.85 0.78  GFRNONAA >60 >60 >60 >60    LIVER FUNCTION TESTS: Recent Labs    05/19/23 1759 05/20/23 0355 05/31/23 0935 09/26/23 1039  BILITOT 0.6 1.0 0.7 0.8  AST 33 37 19 23  ALT 26 26 21 23   ALKPHOS 46 48 71 77  PROT 5.4* 5.5* 7.1 7.4  ALBUMIN 2.8* 3.0* 3.9 4.1    TUMOR MARKERS: No results for input(s): "AFPTM", "CEA", "CA199", "CHROMGRNA" in the last 8760 hours.  Assessment and Plan:  Metastatic neuroendocrine cancer with carcinoid syndrome; liver lesions: Anna Cortez, 77 year old female, presents today to the [facility] Interventional Radiology department for an image-guided microwave ablation of two liver lesions.The procedure will be done under general anesthesia with plans for discharge home later this afternoon.    Risks and benefits of microwave ablation of liver lesions were discussed with the patient and/or patient's family including, but not limited to bleeding, infection, damage to adjacent structures or low yield requiring additional tests.  All of the questions were answered and there is agreement to proceed. She has been NPO. She is a full code. She does not take any blood-thinning medications.   Consent signed and in chart.  Thank you for this interesting consult.  I greatly enjoyed meeting Anna Cortez and look forward to participating in their care.  A copy of this report was sent to the requesting provider on this date.  Electronically Signed: Mickie Kay, NP 10/10/2023, 7:54 AM   I spent a total of  30 minutes in face to face in clinical consultation, greater than 50% of which was counseling/coordinating care for CT guided microwave ablation of liver lesions.

## 2023-10-09 NOTE — H&P (Signed)
  The note originally documented on this encounter has been moved the the encounter in which it belongs.

## 2023-10-09 NOTE — Anesthesia Preprocedure Evaluation (Signed)
Anesthesia Evaluation  Patient identified by MRN, date of birth, ID band Patient awake    Reviewed: Allergy & Precautions, NPO status , Patient's Chart, lab work & pertinent test results  History of Anesthesia Complications Negative for: history of anesthetic complications  Airway Mallampati: III  TM Distance: >3 FB Neck ROM: Full    Dental  (+) Dental Advisory Given, Implants   Pulmonary neg pulmonary ROS   Pulmonary exam normal        Cardiovascular hypertension, Normal cardiovascular exam     Neuro/Psych  PSYCHIATRIC DISORDERS Anxiety Depression    negative neurological ROS     GI/Hepatic Neg liver ROS,GERD  Controlled,, Carcinoid tumor of appendix    Endo/Other  negative endocrine ROS    Renal/GU CRFRenal disease     Musculoskeletal  (+) Arthritis ,    Abdominal   Peds  Hematology negative hematology ROS (+)   Anesthesia Other Findings   Reproductive/Obstetrics                             Anesthesia Physical Anesthesia Plan  ASA: 3  Anesthesia Plan: General   Post-op Pain Management: Tylenol PO (pre-op)*   Induction: Intravenous  PONV Risk Score and Plan: 3 and Treatment may vary due to age or medical condition, Ondansetron and Propofol infusion  Airway Management Planned: Oral ETT  Additional Equipment: None  Intra-op Plan:   Post-operative Plan: Extubation in OR  Informed Consent: I have reviewed the patients History and Physical, chart, labs and discussed the procedure including the risks, benefits and alternatives for the proposed anesthesia with the patient or authorized representative who has indicated his/her understanding and acceptance.     Dental advisory given  Plan Discussed with: CRNA and Anesthesiologist  Anesthesia Plan Comments:         Anesthesia Quick Evaluation

## 2023-10-10 ENCOUNTER — Encounter (HOSPITAL_COMMUNITY)
Admission: RE | Disposition: A | Discharge status: Home / Self Care | Payer: Self-pay | Source: Ambulatory Visit | Attending: Interventional Radiology

## 2023-10-10 ENCOUNTER — Other Ambulatory Visit: Payer: Self-pay

## 2023-10-10 ENCOUNTER — Ambulatory Visit (HOSPITAL_COMMUNITY): Payer: BC Managed Care – PPO | Admitting: Anesthesiology

## 2023-10-10 ENCOUNTER — Ambulatory Visit (HOSPITAL_COMMUNITY)
Admission: RE | Admit: 2023-10-10 | Discharge: 2023-10-10 | Disposition: A | Discharge status: Home / Self Care | Payer: BC Managed Care – PPO | Source: Ambulatory Visit | Attending: Interventional Radiology | Admitting: Interventional Radiology

## 2023-10-10 ENCOUNTER — Ambulatory Visit (HOSPITAL_COMMUNITY): Payer: BC Managed Care – PPO | Admitting: Physician Assistant

## 2023-10-10 ENCOUNTER — Encounter (HOSPITAL_COMMUNITY): Payer: Self-pay

## 2023-10-10 ENCOUNTER — Encounter (HOSPITAL_COMMUNITY): Payer: Self-pay | Admitting: Interventional Radiology

## 2023-10-10 DIAGNOSIS — Z9079 Acquired absence of other genital organ(s): Secondary | ICD-10-CM | POA: Insufficient documentation

## 2023-10-10 DIAGNOSIS — Z90722 Acquired absence of ovaries, bilateral: Secondary | ICD-10-CM | POA: Diagnosis not present

## 2023-10-10 DIAGNOSIS — E3400 Carcinoid syndrome, unspecified: Secondary | ICD-10-CM | POA: Diagnosis not present

## 2023-10-10 DIAGNOSIS — C7B8 Other secondary neuroendocrine tumors: Secondary | ICD-10-CM | POA: Insufficient documentation

## 2023-10-10 DIAGNOSIS — C7A8 Other malignant neuroendocrine tumors: Secondary | ICD-10-CM | POA: Diagnosis present

## 2023-10-10 DIAGNOSIS — Z9049 Acquired absence of other specified parts of digestive tract: Secondary | ICD-10-CM | POA: Insufficient documentation

## 2023-10-10 DIAGNOSIS — D3A8 Other benign neuroendocrine tumors: Secondary | ICD-10-CM | POA: Insufficient documentation

## 2023-10-10 DIAGNOSIS — N189 Chronic kidney disease, unspecified: Secondary | ICD-10-CM | POA: Insufficient documentation

## 2023-10-10 DIAGNOSIS — I129 Hypertensive chronic kidney disease with stage 1 through stage 4 chronic kidney disease, or unspecified chronic kidney disease: Secondary | ICD-10-CM | POA: Insufficient documentation

## 2023-10-10 DIAGNOSIS — Z01818 Encounter for other preprocedural examination: Secondary | ICD-10-CM

## 2023-10-10 HISTORY — PX: RADIOLOGY WITH ANESTHESIA: SHX6223

## 2023-10-10 LAB — ABO/RH: ABO/RH(D): O POS

## 2023-10-10 SURGERY — CT WITH ANESTHESIA
Anesthesia: General

## 2023-10-10 MED ORDER — PROPOFOL 10 MG/ML IV BOLUS
INTRAVENOUS | Status: DC | PRN
  Administered 2023-10-10: 160 mg via INTRAVENOUS

## 2023-10-10 MED ORDER — LEVOFLOXACIN IN D5W 500 MG/100ML IV SOLN
500.0000 mg | INTRAVENOUS | Status: AC
  Administered 2023-10-10: 500 mg via INTRAVENOUS
  Filled 2023-10-10: qty 100

## 2023-10-10 MED ORDER — ROCURONIUM BROMIDE 100 MG/10ML IV SOLN
INTRAVENOUS | Status: DC | PRN
  Administered 2023-10-10: 70 mg via INTRAVENOUS
  Administered 2023-10-10: 30 mg via INTRAVENOUS
  Administered 2023-10-10: 15 mg via INTRAVENOUS

## 2023-10-10 MED ORDER — DEXAMETHASONE SODIUM PHOSPHATE 10 MG/ML IJ SOLN
INTRAMUSCULAR | Status: DC | PRN
  Administered 2023-10-10: 8 mg via INTRAVENOUS

## 2023-10-10 MED ORDER — IOHEXOL 300 MG/ML  SOLN
80.0000 mL | Freq: Once | INTRAMUSCULAR | Status: AC | PRN
  Administered 2023-10-10: 80 mL via INTRAVENOUS

## 2023-10-10 MED ORDER — ONDANSETRON HCL 4 MG/2ML IJ SOLN: 4.0000 mg | Freq: Once | INTRAMUSCULAR | Status: DC

## 2023-10-10 MED ORDER — ONDANSETRON HCL 4 MG/2ML IJ SOLN
INTRAMUSCULAR | Status: AC
  Administered 2023-10-10: 4 mg via INTRAVENOUS
  Filled 2023-10-10: qty 2

## 2023-10-10 MED ORDER — FENTANYL CITRATE (PF) 100 MCG/2ML IJ SOLN
INTRAMUSCULAR | Status: DC | PRN
  Administered 2023-10-10 (×2): 50 ug via INTRAVENOUS

## 2023-10-10 MED ORDER — IOHEXOL 300 MG/ML  SOLN
100.0000 mL | Freq: Once | INTRAMUSCULAR | Status: AC | PRN
  Administered 2023-10-10: 100 mL via INTRAVENOUS

## 2023-10-10 MED ORDER — PHENYLEPHRINE HCL-NACL 20-0.9 MG/250ML-% IV SOLN
INTRAVENOUS | Status: DC | PRN
  Administered 2023-10-10: 30 ug/min via INTRAVENOUS

## 2023-10-10 MED ORDER — SODIUM CHLORIDE 0.9 % IV SOLN
50.0000 ug/h | INTRAVENOUS | Status: AC
  Administered 2023-10-10: 50 ug/h via INTRAVENOUS
  Filled 2023-10-10: qty 1

## 2023-10-10 MED ORDER — METRONIDAZOLE 500 MG/100ML IV SOLN
500.0000 mg | INTRAVENOUS | Status: AC
  Administered 2023-10-10: 500 mg via INTRAVENOUS
  Filled 2023-10-10: qty 100

## 2023-10-10 MED ORDER — OXYCODONE HCL 5 MG PO TABS: 5.0000 mg | ORAL_TABLET | Freq: Once | ORAL | Status: DC | PRN

## 2023-10-10 MED ORDER — ONDANSETRON HCL 4 MG/2ML IJ SOLN: 4.0000 mg | Freq: Once | INTRAMUSCULAR | Status: AC | PRN

## 2023-10-10 MED ORDER — EPHEDRINE SULFATE-NACL 50-0.9 MG/10ML-% IV SOSY
PREFILLED_SYRINGE | INTRAVENOUS | Status: DC | PRN
  Administered 2023-10-10 (×2): 5 mg via INTRAVENOUS

## 2023-10-10 MED ORDER — FENTANYL CITRATE PF 50 MCG/ML IJ SOSY: 25.0000 ug | PREFILLED_SYRINGE | INTRAMUSCULAR | Status: DC | PRN

## 2023-10-10 MED ORDER — PHENYLEPHRINE HCL-NACL 20-0.9 MG/250ML-% IV SOLN
INTRAVENOUS | Status: AC
  Filled 2023-10-10: qty 250

## 2023-10-10 MED ORDER — FENTANYL CITRATE (PF) 100 MCG/2ML IJ SOLN
INTRAMUSCULAR | Status: AC
  Filled 2023-10-10: qty 2

## 2023-10-10 MED ORDER — LIDOCAINE HCL (CARDIAC) PF 100 MG/5ML IV SOSY
PREFILLED_SYRINGE | INTRAVENOUS | Status: DC | PRN
  Administered 2023-10-10: 60 mg via INTRAVENOUS

## 2023-10-10 MED ORDER — HYDROCODONE-ACETAMINOPHEN 5-325 MG PO TABS: 1.0000 | ORAL_TABLET | ORAL | 0 refills | Status: AC | PRN

## 2023-10-10 MED ORDER — SODIUM CHLORIDE 0.9 % IV SOLN: INTRAVENOUS | Status: DC

## 2023-10-10 MED ORDER — ACETAMINOPHEN 500 MG PO TABS
1000.0000 mg | ORAL_TABLET | Freq: Once | ORAL | Status: AC
  Administered 2023-10-10: 1000 mg via ORAL
  Filled 2023-10-10: qty 2

## 2023-10-10 MED ORDER — PHENYLEPHRINE HCL (PRESSORS) 10 MG/ML IV SOLN
INTRAVENOUS | Status: DC | PRN
  Administered 2023-10-10 (×3): 160 ug via INTRAVENOUS

## 2023-10-10 MED ORDER — ONDANSETRON HCL 4 MG/2ML IJ SOLN
INTRAMUSCULAR | Status: DC | PRN
  Administered 2023-10-10: 4 mg via INTRAVENOUS

## 2023-10-10 MED ORDER — SUGAMMADEX SODIUM 200 MG/2ML IV SOLN
INTRAVENOUS | Status: DC | PRN
  Administered 2023-10-10: 200 mg via INTRAVENOUS
  Administered 2023-10-10: 100 mg via INTRAVENOUS

## 2023-10-10 MED ORDER — OXYCODONE HCL 5 MG/5ML PO SOLN: 5.0000 mg | Freq: Once | ORAL | Status: DC | PRN

## 2023-10-10 NOTE — Procedures (Signed)
Interventional Radiology Procedure Note  Procedure:   [place] and CT guided tissue ablation of 2 liver neuroendocrine lesions. 1st = right liver 2nd = left liver  Anesthesia: GETA  Complications: None  Recommendations:   - To PACU  - Anticipate ~2 hour recovery.  OK for DC when goals met: pain control, no bleeding, awake/oriented, tolerating diet - routine wound care - advance diet to full - Ok to shower tomorrow - Do not submerge for 7 days - Follow up with Dr. Loreta Ave in 4-6 weeks in clinic.  No imaging necessary  Signed,  Yvone Neu. Loreta Ave, DO, ABVM, RPVI

## 2023-10-10 NOTE — Addendum Note (Signed)
Addendum  created 10/10/23 1241 by Randa Evens, CRNA   Intraprocedure Meds edited

## 2023-10-10 NOTE — Transfer of Care (Signed)
Immediate Anesthesia Transfer of Care Note  Patient: Anna Cortez  Procedure(s) Performed: CT WITH MICROWAVE OF THE LIVER  Patient Location: PACU  Anesthesia Type:General  Level of Consciousness: drowsy  Airway & Oxygen Therapy: Patient Spontanous Breathing and Patient connected to face mask  Post-op Assessment: Report given to RN  Post vital signs: Reviewed and stable  Last Vitals:  Vitals Value Taken Time  BP 132/64 10/10/23 1121  Temp 36.6 C 10/10/23 1121  Pulse 70 10/10/23 1124  Resp 21 10/10/23 1124  SpO2 99 % 10/10/23 1124  Vitals shown include unfiled device data.  Last Pain:  Vitals:   10/10/23 1121  PainSc: Asleep         Complications:  Encounter Notable Events  Notable Event Outcome Phase Comment  Difficult to intubate - expected  Intraprocedure Filed from anesthesia note documentation.

## 2023-10-10 NOTE — Sedation Documentation (Signed)
Anesthesia at bedside to sedate and monitor. 

## 2023-10-10 NOTE — Addendum Note (Signed)
Addendum  created 10/10/23 1242 by Randa Evens, CRNA   Flowsheet accepted

## 2023-10-10 NOTE — Anesthesia Procedure Notes (Signed)
Procedure Name: Intubation Date/Time: 10/10/2023 8:35 AM  Performed by: Randa Evens, CRNAPre-anesthesia Checklist: Patient identified, Emergency Drugs available, Suction available and Patient being monitored Patient Re-evaluated:Patient Re-evaluated prior to induction Oxygen Delivery Method: Circle System Utilized Preoxygenation: Pre-oxygenation with 100% oxygen Induction Type: IV induction Ventilation: Mask ventilation without difficulty Laryngoscope Size: Glidescope and 3 Grade View: Grade I Tube type: Oral Number of attempts: 2 (Initial attempt with flexible stylet; easy intubation with rigid stylet and glidescope LoPro S3) Airway Equipment and Method: Stylet and Oral airway Placement Confirmation: ETT inserted through vocal cords under direct vision, positive ETCO2 and breath sounds checked- equal and bilateral Secured at: 22 cm Tube secured with: Tape Dental Injury: Teeth and Oropharynx as per pre-operative assessment  Difficulty Due To: Difficulty was anticipated and Difficult Airway- due to anterior larynx

## 2023-10-10 NOTE — Addendum Note (Signed)
Addendum  created 10/10/23 1335 by Randa Evens, CRNA   Intraprocedure Event edited

## 2023-10-10 NOTE — Anesthesia Postprocedure Evaluation (Signed)
Anesthesia Post Note  Patient: Anna Cortez  Procedure(s) Performed: CT WITH MICROWAVE OF THE LIVER     Patient location during evaluation: PACU Anesthesia Type: General Level of consciousness: awake and alert Pain management: pain level controlled Vital Signs Assessment: post-procedure vital signs reviewed and stable Respiratory status: spontaneous breathing, nonlabored ventilation and respiratory function stable Cardiovascular status: stable and blood pressure returned to baseline Anesthetic complications: yes   Encounter Notable Events  Notable Event Outcome Phase Comment  Difficult to intubate - expected  Intraprocedure Filed from anesthesia note documentation.    Last Vitals:  Vitals:   10/10/23 1200 10/10/23 1215  BP: (!) 141/107 136/64  Pulse: 73 68  Resp: 17 (!) 21  Temp:    SpO2: 98% 99%    Last Pain:  Vitals:   10/10/23 1215  PainSc: 0-No pain                 Beryle Lathe

## 2023-10-11 ENCOUNTER — Encounter (HOSPITAL_COMMUNITY): Payer: Self-pay | Admitting: Interventional Radiology

## 2023-10-25 ENCOUNTER — Encounter: Payer: Self-pay | Admitting: Nurse Practitioner

## 2023-10-25 ENCOUNTER — Other Ambulatory Visit: Payer: Self-pay | Admitting: Interventional Radiology

## 2023-10-25 DIAGNOSIS — D3A8 Other benign neuroendocrine tumors: Secondary | ICD-10-CM

## 2023-11-05 NOTE — Telephone Encounter (Signed)
 Pt is schedule to be seen on Friday, will be getting her b12 then.

## 2023-11-09 ENCOUNTER — Ambulatory Visit (INDEPENDENT_AMBULATORY_CARE_PROVIDER_SITE_OTHER): Payer: BC Managed Care – PPO | Admitting: Nurse Practitioner

## 2023-11-09 VITALS — BP 136/64 | HR 64 | Temp 98.1°F | Ht 69.0 in | Wt 187.2 lb

## 2023-11-09 DIAGNOSIS — R5383 Other fatigue: Secondary | ICD-10-CM

## 2023-11-09 DIAGNOSIS — I10 Essential (primary) hypertension: Secondary | ICD-10-CM | POA: Diagnosis not present

## 2023-11-09 DIAGNOSIS — L659 Nonscarring hair loss, unspecified: Secondary | ICD-10-CM | POA: Insufficient documentation

## 2023-11-09 DIAGNOSIS — E785 Hyperlipidemia, unspecified: Secondary | ICD-10-CM | POA: Diagnosis not present

## 2023-11-09 MED ORDER — CYANOCOBALAMIN 1000 MCG/ML IJ SOLN: 1000.0000 ug | Freq: Once | INTRAMUSCULAR | Status: AC

## 2023-11-09 NOTE — Assessment & Plan Note (Signed)
 Chronic Per shared decision making patient prefers to hold off on starting pharmacological therapy.  She would like to instead focus on diet and return to clinic in 4 months in a fasting state to repeat lipid panel.  I am agreeable to this.

## 2023-11-09 NOTE — Progress Notes (Signed)
 Established Patient Office Visit  Subjective   Patient ID: Anna Cortez, female    DOB: 04-10-1946  Age: 78 y.o. MRN: [id number]  Chief Complaint  Patient presents with   Fatigue    Hair Loss/Fatigue: B12 level when checked in 07/2023 was 286 and patient had been symptomatic with fatigue and hair loss. She has started a B12 daily tablet of 1000mcg.  She also mentions that she will get at times a sensation of burning or shooting pains especially in her upper extremities.  This is intermittent and she does not identify any skin changes when this occurs.  She does have neuroendocrine cancer but no history of chemotherapy treatment.  HTN/HLD: Blood pressure borderline hypertensive.  Currently not on antihypertensive therapy.  Last LDL elevated at 122, however this was not a fasting panel.  Patient prefers not to start medication at this time.    Review of Systems  Constitutional:  Positive for malaise/fatigue.  Respiratory:  Negative for shortness of breath.   Cardiovascular:  Negative for chest pain.  Gastrointestinal:  Positive for diarrhea. Negative for blood in stool.  Skin:        (+) hair loss      Objective:     BP 136/64   Pulse 64   Temp 98.1 F (36.7 C) (Temporal)   Ht 5' 9 (1.753 m)   Wt 187 lb 4 oz (84.9 kg)   SpO2 98%   BMI 27.65 kg/m  BP Readings from Last 3 Encounters:  11/09/23 136/64  10/10/23 (!) 148/75  10/10/23 (!) 162/72   Wt Readings from Last 3 Encounters:  11/09/23 187 lb 4 oz (84.9 kg)  10/10/23 180 lb (81.6 kg)  09/26/23 181 lb (82.1 kg)      Physical Exam Vitals reviewed.  Constitutional:      General: She is not in acute distress.    Appearance: Normal appearance.  HENT:     Head: Normocephalic and atraumatic.  Neck:     Vascular: No carotid bruit.  Cardiovascular:     Rate and Rhythm: Normal rate and regular rhythm.     Pulses: Normal pulses.     Heart sounds: Normal heart sounds.  Pulmonary:     Effort: Pulmonary  effort is normal.     Breath sounds: Normal breath sounds.  Skin:    General: Skin is warm and dry.  Neurological:     General: No focal deficit present.     Mental Status: She is alert and oriented to person, place, and time.  Psychiatric:        Mood and Affect: Mood normal.        Behavior: Behavior normal.        Judgment: Judgment normal.      No results found for any visits on 11/09/23.    The 10-year ASCVD risk score (Arnett DK, et al., 2019) is: 18.2%    Assessment & Plan:   Problem List Items Addressed This Visit       Cardiovascular and Mediastinum   Essential hypertension - Primary   Chronic, stable For now continue off of antihypertensive treatment per patient preference Continue to monitor closely        Other   Hyperlipidemia   Chronic Per shared decision making patient prefers to hold off on starting pharmacological therapy.  She would like to instead focus on diet and return to clinic in 4 months in a fasting state to repeat lipid panel.  I am agreeable  to this.      Relevant Orders   Vitamin B12   Comprehensive metabolic panel   Lipid panel   Fatigue   Chronic Will treat low vitamin B12 level with IM injection x 3 and daily oral supplementation.  Patient to return to clinic in 4 months to recheck serum vitamin B12, further recommendations may be made based upon these results.      Hair loss   Chronic Will treat low vitamin B12 level with IM injection x 3 and daily oral supplementation.  Patient to return to clinic in 4 months to recheck serum vitamin B12, further recommendations may be made based upon these results.      Relevant Medications   cyanocobalamin  (VITAMIN B12) injection 1,000 mcg   Other Relevant Orders   Vitamin B12    Return in about 4 months (around 03/08/2024) for F/U with Lauraine; also monthly b12 injection with nurse x 3.    Lauraine FORBES Pereyra, NP

## 2023-11-09 NOTE — Assessment & Plan Note (Signed)
 Chronic, stable For now continue off of antihypertensive treatment per patient preference Continue to monitor closely

## 2023-11-09 NOTE — Assessment & Plan Note (Signed)
 Chronic Will treat low vitamin B12 level with IM injection x 3 and daily oral supplementation.  Patient to return to clinic in 4 months to recheck serum vitamin B12, further recommendations may be made based upon these results.

## 2023-11-13 NOTE — Progress Notes (Signed)
 Patient Care Team: Zorita Hiss, NP as PCP - General (Nurse Practitioner) Sonja Somerset, MD as Consulting Physician (Hematology and Oncology) Rolin Clifton, NP as Nurse Practitioner (Hematology and Oncology) Sharyon Deis, NP as Nurse Practitioner (Hematology and Oncology)  Date of Service: 11/14/2023  CHIEF COMPLAINT: Follow up metastatic NET  Oncology History  Metastatic carcinoid tumor (HCC)  11/2012 Initial Diagnosis   Right hemicolectomy and bilateral salpingo-oophorectomy: 3 cm grade 2 well-differentiated neuroendocrine tumor lymphovascular invasion and perineural invasion present, 6/17 lymph nodes positive, 3 mitosis per 10 high-power fields Ki-67 2 to 5% appendix positive, small bowel mesenteric nodule positive, right and left ovaries positive T4 N1 M1   03/2014 Imaging   Liver metastases   04/2016 Imaging   Dotatate PET: Left subdiaphragmatic lesion 1 cm   04/2016 Miscellaneous   Sandostatin  LAR continued till October 2019 and then she was lost to follow-up   08/07/2017 Imaging   Redemonstration of multiple dotatate avid lesions in the liver suggestive of residual neuroendocrine metastases uptake has decreased   08/23/2017 Miscellaneous   Until a 02/14/2018 4 doses of Lutathera given, Sandostatin  continued (all the above care was done at [facility] and then transferred her care to [facility] who started on Telotristat Ethyl  for diarrhea orally   08/17/2018 Imaging   Dotatate PET: Similar distribution liver and left phrenic lymph node mild uptake in right supraclavicular lymph node, 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 Mabel Savage; Currently on  Xermelo  for symptom management   INTERVAL HISTORY Anna Cortez returns for follow up as scheduled. Last seen by me 06/28/23 and referred to IR, s/p ablation by Dr. Mabel Savage 10/10/23.  She tolerated the procedure well, received Sandostatin  drip and woke up feeling bloated.  She had delayed onset tenderness about a week after the procedure which is improving.  Diarrhea resolved for about a month and is starting back, went twice already this morning.  She ran out of Lomotil , was taking 1-2 a day, and has also not been on Xermelo .  She would like to restart both.  ROS  All other systems reviewed and negative  Past Medical History:  Diagnosis Date   Anxiety    Degenerative disc disease    Depression    Insomnia    Neuroendocrine cancer (HCC)    Osteoarthritis      Past Surgical History:  Procedure Laterality Date   APPENDECTOMY     IR RADIOLOGIST EVAL & MGMT  07/09/2023   IR RADIOLOGIST EVAL & MGMT  11/14/2023   KNEE SURGERY     Right Knee   RADIOLOGY WITH ANESTHESIA N/A 10/10/2023   Procedure: CT WITH MICROWAVE OF THE LIVER;  Surgeon: Myrlene Asper, DO;  Location: [facility];  Service: Anesthesiology;  Laterality: N/A;   TONSILLECTOMY     TUBAL LIGATION     WISDOM TOOTH EXTRACTION       Outpatient Encounter Medications as of 11/14/2023  Medication Sig   diphenoxylate -atropine  (LOMOTIL ) 2.5-0.025 MG tablet Take 1-2 tablets by mouth 4 (four) times daily as needed for diarrhea or loose stools.   cyanocobalamin  (VITAMIN B12) 1000 MCG tablet Take 1,000 mcg by mouth 3 (three) times a  week.   cyanocobalamin  (VITAMIN B12) 1000 MCG tablet Take 1,000 mcg by mouth daily.   Telotristat Ethyl ,as Etiprate, (XERMELO ) 250 MG TABS Take 1 tablet (250 mg total) by mouth 3 (three) times daily. Take with food.   [DISCONTINUED] Telotristat Ethyl ,as Etiprate, (XERMELO ) 250 MG TABS Take 1 tablet (250 mg total) by mouth 3 (three) times daily. Take with food. (Patient not taking: Reported on 11/09/2023)    Facility-Administered Encounter Medications as of 11/14/2023  Medication   cyanocobalamin  (VITAMIN B12) injection 1,000 mcg     Today's Vitals   11/14/23 0920  BP: 116/79  Pulse: (!) 57  Resp: 16  Temp: 97.6 F (36.4 C)  TempSrc: Temporal  SpO2: 98%  Weight: 188 lb 1.6 oz (85.3 kg)  Height: 5\' 9"  (1.753 m)   Body mass index is 27.78 kg/m.   PHYSICAL EXAM GENERAL:alert, no distress and comfortable SKIN: no rash  EYES: sclera clear NECK: without mass LYMPH:  no palpable cervical or supraclavicular lymphadenopathy  LUNGS: clear with normal breathing effort HEART: regular rate & rhythm, no lower extremity edema ABDOMEN: abdomen soft, non-tender and normal bowel sounds NEURO: alert & oriented x 3 with fluent speech   CBC    Component Value Date/Time   WBC 5.2 09/26/2023 1039   RBC 3.87 09/26/2023 1039   HGB 13.2 09/26/2023 1039   HGB 12.8 05/31/2023 0935   HCT 38.3 09/26/2023 1039   PLT 173 09/26/2023 1039   PLT 227 05/31/2023 0935   MCV 99.0 09/26/2023 1039   MCH 34.1 (H) 09/26/2023 1039   MCHC 34.5 09/26/2023 1039   RDW 12.9 09/26/2023 1039   LYMPHSABS 1.4 05/31/2023 0935   MONOABS 0.6 05/31/2023 0935   EOSABS 0.1 05/31/2023 0935   BASOSABS 0.1 05/31/2023 0935     CMP     Component Value Date/Time   NA 137 09/26/2023 1039   K 4.2 09/26/2023 1039   CL 104 09/26/2023 1039   CO2 24 09/26/2023 1039   GLUCOSE 103 (H) 09/26/2023 1039   BUN 22 09/26/2023 1039   CREATININE 0.78 09/26/2023 1039   CREATININE 0.85 05/31/2023 0935   CREATININE 0.93 12/22/2011 1145   CALCIUM 9.4 09/26/2023 1039   PROT 7.4 09/26/2023 1039   ALBUMIN 4.1 09/26/2023 1039   AST 23 09/26/2023 1039   AST 19 05/31/2023 0935   ALT 23 09/26/2023 1039   ALT 21 05/31/2023 0935   ALKPHOS 77 09/26/2023 1039   BILITOT 0.8 09/26/2023 1039   BILITOT 0.7 05/31/2023 0935   GFRNONAA >60 09/26/2023 1039   GFRNONAA >60 05/31/2023 0935     ASSESSMENT & PLAN:78 year old female   Metastatic  well-differentiated carcinoid tumor -I reviewed her detailed medical history with the patient and her sister.  -08/2012 Initially diagnosed on screening colonoscopy, 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 Almira Armour at [facility]; - Path showed well-differentiated NET of the small bowel and appendix, with metastatic mesenteric nodule and bilateral ovarian involvement, stage pT4, N1, M1. Went on observation  -04/2014 liver metastasis on MRI, showing multiple lesions suspicious for metastatic NET.  Symptoms of flushing and diarrhea were present. Continued observation until carcinoid syndrome worsened  -04/2016 began first-line octreotide  20 mg, lost f/up 2018 - 07/2017 off sandostatin . Sx worsened  -S/p Lutathera x4 (08/23/17, 10/18/17, 2/29/19, and 02/14/18 + sandostatin ) and continued sandostatin .   -Relocated to [place]  11/2018, followed by [facility] where she continued monthly Sandostatin  - 01/13/2021, d/c'd per pt due  to lack of efficacy  -2nd opinion at [facility], continued supportive care -Began lanreotide 08/2021 and retreated with Lutathera 08/2021 - 02/10/22 x 3 doses; dose held due to poor tolerance (fatigue and nausea).  -Last lanreotide 01/2022 -Hospitalized 04/2023 for viral meningitis, recovered well -05/31/23: Chromogranin A and 5HIAA urine normal, started Xermelo  for diarrhea -06/28/23: Anna Cortez appears well. Weight is stable. Diarrhea slightly improved on Xermelo , I recommend to add lomotil  for additional results.  -dotatate PET scan shows 2 hypermetabolic liver lesions c/w metastatic NET. [facility] ([facility]), but per the report from last PET scan in 2022 this is improvement.  -Given that she remains symptomatic with carcinoid syndrome diarrhea, referred to IR s/p ablation 10/10/23.  Xermelo  on hold -Anna Cortez appears stable, s/p ablation 10/10/2023 by Dr. Mabel Savage.  Tolerated the procedure well,  mild pain is improving.  Diarrhea resolved for about a month and is starting back, mild -Plan to restart Xermelo , will verify insurance and she met with Claire Medlin today for patient assistance process.  I also refilled Lomotil  -Follow-up with restaging in 3 months, or sooner if needed   Diarrhea, flushing, abdominal pain -Secondary to carcinoid syndrome #1 -She has tried Imodium without efficacy, side notes suggest she has failed Questran as well.   -05/31/23: began Xermelo , partially effective -06/28/23: I recommend to add lomotil  PRN -10/10/23: S/p ablation, diarrhea resolved for 1 month and has started back. Xermelo  on hold since procedure. Plan to restart Xermelo  and PRN lomotil   3. Social -She lives independently but is very close with her sister who is involved in her care        PLAN: -S/p ablation 10/10/23 by IR Dr. Mabel Savage, phone f/up with MD today -Diarrhea syndrome improved s/p ablation but has recurred, resume Xermelo  -Refilled Xermelo  and lomotil , met with Anastacio Balm [employer] for pt assistance application -lab in 3 months with imaging, f/up a week later       All questions were answered. The patient knows to call the clinic with any problems, questions or concerns. No barriers to learning were detected.   Bricen Victory, NP-C 11/15/2023

## 2023-11-14 ENCOUNTER — Telehealth: Payer: Self-pay | Admitting: Pharmacy Technician

## 2023-11-14 ENCOUNTER — Ambulatory Visit: Payer: BC Managed Care – PPO | Admitting: Nurse Practitioner

## 2023-11-14 ENCOUNTER — Other Ambulatory Visit: Payer: Self-pay

## 2023-11-14 ENCOUNTER — Other Ambulatory Visit (HOSPITAL_COMMUNITY): Payer: Self-pay

## 2023-11-14 ENCOUNTER — Inpatient Hospital Stay: Payer: BC Managed Care – PPO | Attending: Nurse Practitioner | Admitting: Nurse Practitioner

## 2023-11-14 ENCOUNTER — Ambulatory Visit
Admission: RE | Admit: 2023-11-14 | Discharge: 2023-11-14 | Disposition: A | Discharge status: Home / Self Care | Payer: BC Managed Care – PPO | Source: Ambulatory Visit | Attending: Interventional Radiology

## 2023-11-14 DIAGNOSIS — C7B00 Secondary carcinoid tumors, unspecified site: Secondary | ICD-10-CM | POA: Diagnosis not present

## 2023-11-14 DIAGNOSIS — E3400 Carcinoid syndrome, unspecified: Secondary | ICD-10-CM | POA: Diagnosis not present

## 2023-11-14 DIAGNOSIS — R197 Diarrhea, unspecified: Secondary | ICD-10-CM | POA: Insufficient documentation

## 2023-11-14 DIAGNOSIS — C7B02 Secondary carcinoid tumors of liver: Secondary | ICD-10-CM | POA: Diagnosis not present

## 2023-11-14 DIAGNOSIS — C7A022 Malignant carcinoid tumor of the ascending colon: Secondary | ICD-10-CM | POA: Insufficient documentation

## 2023-11-14 DIAGNOSIS — R232 Flushing: Secondary | ICD-10-CM | POA: Diagnosis not present

## 2023-11-14 DIAGNOSIS — R109 Unspecified abdominal pain: Secondary | ICD-10-CM | POA: Diagnosis not present

## 2023-11-14 DIAGNOSIS — D3A8 Other benign neuroendocrine tumors: Secondary | ICD-10-CM

## 2023-11-14 HISTORY — PX: IR RADIOLOGIST EVAL & MGMT: IMG5224

## 2023-11-14 MED ORDER — DIPHENOXYLATE-ATROPINE 2.5-0.025 MG PO TABS
1.0000 | ORAL_TABLET | Freq: Four times a day (QID) | ORAL | 0 refills | Status: AC | PRN
Start: 2023-11-14 — End: ?

## 2023-11-14 MED ORDER — XERMELO 250 MG PO TABS
1.0000 | ORAL_TABLET | Freq: Three times a day (TID) | ORAL | 1 refills | Status: DC
  Filled 2023-11-14: qty 84, 28d supply, fill #0

## 2023-11-14 NOTE — Progress Notes (Signed)
 Chief Complaint: Metastatic neuroendocrine tumor  Referring Physician(s): Athol Bolds  History of Present Illness: Anna Cortez is a 78 y.o. female presenting as a follow up to VIR clinic, post op visit, for recent microwave ablation that was performed 10/10/23.   Ms Basel joins us  by telemedicine visit, by herself.  We confirmed her identity with 2 personal identifiers.   History:  We first met her 07/09/23  past history of treatment for the neuroendorine tumor (in detail below), and her current symptoms. Currently, her main complaint is loss of sleep, as she finds it difficult to fall asleep daily, and she is frequently woken in the middle of the night by flushing.  Also, she has diarrhea daily.  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  She complains also of flushing and hot flashes.    She denies any loss of appetite.  She denies hematemesis, melena, hematochezia.  She has occasional nausea, but not vomiting.    She is currently establishing PCP, with new appointment in early October.    Her family lives nearby.  She lives alone.    The electronic record details her prior treatment below and the note from 06/28/23:    Oncology History  Metastatic carcinoid tumor (HCC)  11/2012 Initial Diagnosis    Right hemicolectomy and bilateral salpingo-oophorectomy: 3 cm grade 2 well-differentiated neuroendocrine tumor lymphovascular invasion and perineural invasion present, 6/17 lymph nodes positive, 3 mitosis per 10 high-power fields Ki-67 2 to 5% appendix positive, small bowel mesenteric nodule positive, right and left ovaries positive T4 N1 M1    03/2014 Imaging    Liver metastases    04/2016 Imaging    Dotatate PET: Left subdiaphragmatic lesion 1 cm    04/2016 Miscellaneous    Sandostatin  LAR continued till  October 2019 and then she was lost to follow-up    08/07/2017 Imaging    Redemonstration of multiple dotatate avid lesions in the liver suggestive of residual neuroendocrine metastases uptake has decreased    08/23/2017 Miscellaneous    Until a 02/14/2018 4 doses of Lutathera given, Sandostatin  continued (all the above care was done at [facility] and then transferred her care to [facility] who started on Telotristat Ethyl  for diarrhea orally    08/17/2018 Imaging    Dotatate PET: Similar distribution liver and left phrenic lymph node mild uptake in right supraclavicular lymph node, 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     She continues to work.    She has never had ECHO.    MRI 05/22/23 = 6 small hepatic lesions identified.  The largest is ~ 1cm.   DOTATATE PET 06/21/23 = Only 2 of the 6 hepatic lesions are confidently identified.  This includes the largest on MRI, as well as another in the left liver.          There is no correlate to the remainder of the MRI findings on the PET.  There is no imaging available to review before this year.   Interval History:  We treated Ms Milliner 10/10/23 at [facility] with CT guided tissue ablation with microwave technology.  She was discharged home the same day. She was treated for a segment 2 and segment 6 lesion with separate probes.   She tells me she is doing well, with normal appetite, no blood in her stool, no N/V.  She does have some residual mild "nuisance" soreness at the punctures sites.   She says she is trying to get the octreotide  therapy started again, but there are some insurance obstacles.    Past  Medical History:  Diagnosis Date   Anxiety    Degenerative disc disease    Depression    Insomnia    Neuroendocrine cancer (HCC)    Osteoarthritis     Past Surgical History:  Procedure Laterality Date   APPENDECTOMY     IR RADIOLOGIST EVAL & MGMT  07/09/2023   KNEE SURGERY     Right Knee   RADIOLOGY WITH ANESTHESIA N/A 10/10/2023   Procedure: CT WITH MICROWAVE OF THE LIVER;  Surgeon: Myrlene Asper, DO;  Location: [facility];  Service: Anesthesiology;  Laterality: N/A;   TONSILLECTOMY     TUBAL LIGATION     WISDOM TOOTH EXTRACTION      Allergies: Astemizole, Penicillins, Prochlorperazine, Prochlorperazine edisylate, and Tolectin [tolmetin sodium]  Medications: Prior to Admission medications   Medication Sig Start Date End Date Taking? Authorizing Provider  cyanocobalamin  (VITAMIN B12) 1000 MCG tablet Take 1,000 mcg by mouth 3 (three) times a week.    [provider]  cyanocobalamin  (VITAMIN B12) 1000 MCG tablet Take 1,000 mcg by mouth daily.    [provider]  diphenoxylate -atropine  (LOMOTIL ) 2.5-0.025 MG tablet Take 1-2 tablets by mouth 4 (four) times daily as needed for diarrhea or loose stools. 11/14/23   Burton, Lacie K, NP  Telotristat Ethyl ,as Etiprate, (XERMELO ) 250 MG TABS Take 1 tablet (250 mg total) by mouth 3 (three) times daily. Take with food. 11/14/23   Burton, Lacie K, NP     Family History  Problem Relation Age of Onset   Heart disease Neg Hx    Prostate cancer Father    Colon cancer Neg Hx    Breast cancer Neg Hx    Hypertension Neg Hx    Diabetes Neg Hx    Hyperlipidemia Neg Hx     Social History   Socioeconomic History   Marital status: Single    Spouse name: Not on file   Number of children: Not on file   Years of education: Not on file   Highest education level: Some college, no degree  Occupational History   Not on file  Tobacco Use   Smoking status: Never   Smokeless tobacco: Never  Vaping Use   Vaping status: Never Used   Substance and Sexual Activity   Alcohol use: Yes    Comment: beer   Drug use: No   Sexual activity: Never  Other Topics Concern   Not on file  Social History Narrative   Not on file   Social Drivers of Health   Financial Resource Strain: Low Risk  (11/06/2023)   Overall Financial Resource Strain (CARDIA)    Difficulty of Paying Living Expenses: Not hard at all  Food Insecurity: No Food Insecurity (11/06/2023)   Hunger Vital Sign    Worried About Running Out of Food in the Last Year: Never true    Ran Out  of Food in the Last Year: Never true  Transportation Needs: No Transportation Needs (11/06/2023)   PRAPARE - Administrator, Civil Service (Medical): No    Lack of Transportation (Non-Medical): No  Physical Activity: Insufficiently Active (11/06/2023)   Exercise Vital Sign    Days of Exercise per Week: 2 days    Minutes of Exercise per Session: 30 min  Stress: Stress Concern Present (11/06/2023)   [employer] of Occupational Health - Occupational Stress Questionnaire    Feeling of Stress : Rather much  Social Connections: Unknown (11/06/2023)   Social Connection and Isolation Panel [NHANES]    Frequency of Communication with Friends and Family: More than three times a week    Frequency of Social Gatherings with Friends and Family: Twice a week    Attends Religious Services: Patient declined    Database administrator or Organizations: No    Attends Engineer, structural: Not on file    Marital Status: Never married    ECOG Status: 1 - Symptomatic but completely ambulatory  Review of Systems  Review of Systems: A 12 point ROS discussed and pertinent positives are indicated in the HPI above.  All other systems are negative.  Advance Care Plan: The advanced care plan/surrogate decision maker was discussed at the time of visit and documented in the medical record.    Physical Exam No direct physical exam was performed (except for noted visual exam findings  with Video Visits).    Vital Signs: There were no vitals taken for this visit.  Imaging: No results found.  Labs:  CBC: Recent Labs    05/23/23 1046 05/31/23 0935 08/09/23 1124 09/26/23 1039  WBC 5.5 7.5 5.4 5.2  HGB 12.5 12.8 13.6 13.2  HCT 35.6* 36.7 40.9 38.3  PLT 123* 227 194.0 173    COAGS: Recent Labs    05/18/23 2034 09/26/23 1039  INR 1.1 1.0  APTT 29  --     BMP: Recent Labs    05/21/23 0833 05/22/23 0820 05/31/23 0935 09/26/23 1039  NA 135 142 140 137  K 2.8* 3.4* 4.2 4.2  CL 101 102 107 104  CO2 27 30 24 24   GLUCOSE 134* 107* 108* 103*  BUN 10 8 15 22   CALCIUM 8.1* 8.4* 9.2 9.4  CREATININE 0.91 0.88 0.85 0.78  GFRNONAA >60 >60 >60 >60    LIVER FUNCTION TESTS: Recent Labs    05/19/23 1759 05/20/23 0355 05/31/23 0935 09/26/23 1039  BILITOT 0.6 1.0 0.7 0.8  AST 33 37 19 23  ALT 26 26 21 23   ALKPHOS 46 48 71 77  PROT 5.4* 5.5* 7.1 7.4  ALBUMIN 2.8* 3.0* 3.9 4.1    TUMOR MARKERS: No results for input(s): "AFPTM", "CEA", "CA199", "CHROMGRNA" in the last 8760 hours.  Assessment and Plan:  78 yo female with history of metastatic neuroendocrine tumor, with recent microwave ablation of segment 2 and segment 6 lesions 10/10/23.   She is doing fine in recovery with some mild residual TTP at the puncture sites.   I did let her know our plan for future follow up imaging in ~2-3 months from now.  She understands.   Plan: - follow up visit in 2-3 months from now with contrast enhanced abdominal/pelvis CT  - conservative management of residual tenderness at puncture sites, ice prn/OTC's   Electronically Signed: Myrlene Asper 11/14/2023, 1:22 PM   I spent a total of    25 Minutes in remote  clinical  consultation, greater than 50% of which was counseling/coordinating care for neuroendocrine mets, SP microwave ablation.    Visit type: Audio only (telephone). Audio (no video) only due to patient's lack of internet/smartphone  capability. Alternative for in-person consultation at [facility], [address] [place], [place], [place]. This visit type was conducted due to national recommendations for restrictions regarding the COVID-19 Pandemic (e.g. social distancing).  This format is felt to be most appropriate for this patient at this time.  All issues noted in this document were discussed and addressed.

## 2023-11-14 NOTE — Telephone Encounter (Signed)
 Oral Oncology Patient Advocate Encounter   Submitted application for assistance for Xermelo  to [employer].   Application submitted via e-fax to [phone]   [employer] phone number [phone].   I will continue to check the status until final determination.   Paulette Borrow, CPhT-Adv Oncology Pharmacy Patient Advocate [facility] Direct Number: [phone]  Fax: [phone]

## 2023-11-15 ENCOUNTER — Encounter: Payer: Self-pay | Admitting: Nurse Practitioner

## 2023-11-16 NOTE — Telephone Encounter (Signed)
Oral Oncology Patient Advocate Encounter  Attempted to call and check status of PAP application with [employer]. I was unable to maintain a connection with their phone line long enough to get information. I attempted to call back 3 times and the line continually disconnected. I will attempt to check status again on Monday.  Jinger Neighbors, CPhT-Adv Oncology Pharmacy Patient Advocate [facility] Direct Number: [phone]  Fax: [phone]

## 2023-11-20 NOTE — Telephone Encounter (Signed)
Oral Oncology Patient Advocate Encounter  Called to check status of PAP application. Application is complete excluding a benefits call to the patient. The representative states this will likely be completed within 48 hours.  Jinger Neighbors, CPhT-Adv Oncology Pharmacy Patient Advocate [facility] Direct Number: [phone]  Fax: [phone]

## 2023-11-22 ENCOUNTER — Other Ambulatory Visit (HOSPITAL_COMMUNITY): Payer: Self-pay

## 2023-11-22 NOTE — Telephone Encounter (Signed)
Oral Oncology Patient Advocate Encounter  Patient has opted to mail her POI to the program. I will check back in next week if I have not heard from the program regarding a decision by that time.  Jinger Neighbors, CPhT-Adv Oncology Pharmacy Patient Advocate [facility] Direct Number: [phone]  Fax: [phone]

## 2023-11-28 NOTE — Telephone Encounter (Signed)
Oral Oncology Patient Advocate Encounter  Called Lenoria Farrier to check status of the application. Per the representative the patient called in yesterday and informed them she was putting her POI in the mail then. I confirmed they have my number in the patient's case file. The program rep will call me once the POI has been received.  Jinger Neighbors, CPhT-Adv Oncology Pharmacy Patient Advocate [facility] Direct Number: [phone]  Fax: [phone]

## 2023-12-03 ENCOUNTER — Other Ambulatory Visit (HOSPITAL_COMMUNITY): Payer: Self-pay

## 2023-12-03 NOTE — Telephone Encounter (Signed)
Oral Oncology Patient Advocate Encounter  Called and checked status of the application. POI has been received. Case was marked as requiring an LIS denial letter in error. Patient does not have Medicare D coverage and would not qualify for that program.  The case manager has been notified and case will be processed under the commercial denial. I confirmed my number is in the patient's file if any further clarification is needed by the program.  Jinger Neighbors, CPhT-Adv Oncology Pharmacy Patient Advocate [facility] Direct Number: [phone]  Fax: [phone]

## 2023-12-04 ENCOUNTER — Other Ambulatory Visit (HOSPITAL_COMMUNITY): Payer: Self-pay

## 2023-12-04 NOTE — Telephone Encounter (Signed)
 Oral Oncology Patient Advocate Encounter   Received notification that the application for assistance for Xermelo  through [employer] has been denied due to patient income exceeds the program limits. The program limits income to 300% or less of the federal poverty line based on household size. [name] phone number [phone].   I have spoken to the patient.   Anna Cortez, CPhT-Adv Oncology Pharmacy Patient Advocate [facility] Direct Number: [phone]  Fax: [phone]

## 2023-12-13 ENCOUNTER — Ambulatory Visit: Payer: BC Managed Care – PPO

## 2023-12-13 DIAGNOSIS — E538 Deficiency of other specified B group vitamins: Secondary | ICD-10-CM | POA: Diagnosis not present

## 2023-12-13 MED ORDER — CYANOCOBALAMIN 1000 MCG/ML IJ SOLN
1000.0000 ug | Freq: Once | INTRAMUSCULAR | Status: AC
  Administered 2023-12-13: 1000 ug via INTRAMUSCULAR

## 2023-12-13 NOTE — Progress Notes (Signed)
Patient presents in office today for b12 injection, tolerated injection well. Per office note from 11/09/2023 patient will receive 3 injections.  Will return in one month for second injection, next one scheduled.

## 2024-01-08 ENCOUNTER — Encounter: Payer: Self-pay | Admitting: Nurse Practitioner

## 2024-01-10 ENCOUNTER — Ambulatory Visit: Payer: BC Managed Care – PPO | Admitting: Radiology

## 2024-01-10 DIAGNOSIS — E538 Deficiency of other specified B group vitamins: Secondary | ICD-10-CM | POA: Diagnosis not present

## 2024-01-10 MED ORDER — CYANOCOBALAMIN 1000 MCG/ML IJ SOLN
1000.0000 ug | Freq: Once | INTRAMUSCULAR | Status: AC
  Administered 2024-01-10: 1000 ug via INTRAMUSCULAR

## 2024-01-10 NOTE — Progress Notes (Signed)
 Patient here for her last weekly B12 injection. Tolerated well with no complications.

## 2024-01-12 ENCOUNTER — Ambulatory Visit
Admission: EM | Admit: 2024-01-12 | Discharge: 2024-01-12 | Disposition: A | Discharge status: Home / Self Care | Attending: Family Medicine | Admitting: Family Medicine

## 2024-01-12 ENCOUNTER — Encounter: Payer: Self-pay | Admitting: *Deleted

## 2024-01-12 DIAGNOSIS — J40 Bronchitis, not specified as acute or chronic: Secondary | ICD-10-CM | POA: Diagnosis not present

## 2024-01-12 DIAGNOSIS — J329 Chronic sinusitis, unspecified: Secondary | ICD-10-CM | POA: Diagnosis not present

## 2024-01-12 DIAGNOSIS — J309 Allergic rhinitis, unspecified: Secondary | ICD-10-CM

## 2024-01-12 MED ORDER — METHYLPREDNISOLONE ACETATE 80 MG/ML IJ SUSP
80.0000 mg | Freq: Once | INTRAMUSCULAR | Status: AC
  Administered 2024-01-12: 80 mg via INTRAMUSCULAR

## 2024-01-12 NOTE — ED Triage Notes (Signed)
 Patient states 5 days of nasal congestion/cough, worsened cough since yesterday.  Has tried OTC Mucinex

## 2024-01-12 NOTE — ED Provider Notes (Signed)
 [facility]  Note:  This document was prepared using Conservation officer, historic buildings and may include unintentional dictation errors.  MRN: [id number] DOB: July 29, 1946  Subjective:   Anna Cortez is a 78 y.o. female presenting for 5-day history of sinus congestion, drainage, coughing.  Symptoms started to worsen yesterday, reports feeling the cough is affecting her chest now.  Would like aggressive management.  Reports that doing prednisone alone is not enough.  Reports that over-the-counter measures also do not work.  No history of asthma.  Has allergic rhinitis.  No smoking of any kind including cigarettes, cigars, vaping, marijuana use.     Current Facility-Administered Medications:    cyanocobalamin (VITAMIN B12) injection 1,000 mcg, 1,000 mcg, Intramuscular, Once,   Current Outpatient Medications:    cyanocobalamin (VITAMIN B12) 1000 MCG tablet, Take 1,000 mcg by mouth 3 (three) times a week., Disp: , Rfl:    cyanocobalamin (VITAMIN B12) 1000 MCG tablet, Take 1,000 mcg by mouth daily., Disp: , Rfl:    diphenoxylate-atropine (LOMOTIL) 2.5-0.025 MG tablet, Take 1-2 tablets by mouth 4 (four) times daily as needed for diarrhea or loose stools., Disp: 60 tablet, Rfl: 0   Telotristat Ethyl,as Etiprate, (XERMELO) 250 MG TABS, Take 1 tablet (250 mg total) by mouth 3 (three) times daily. Take with food., Disp: 84 tablet, Rfl: 1   Allergies  Allergen Reactions   Astemizole Other (See Comments)    Unknown reaction   Penicillins     Unknown reaction    Prochlorperazine    Prochlorperazine Edisylate     Unknown reaction    Tolectin [Tolmetin Sodium]     Unknown reaction     Past Medical History:  Diagnosis Date   Anxiety    Degenerative disc disease    Depression    Insomnia    Neuroendocrine cancer (HCC)    Osteoarthritis      Past Surgical History:  Procedure Laterality Date   APPENDECTOMY     IR RADIOLOGIST EVAL & MGMT  07/09/2023   IR  RADIOLOGIST EVAL & MGMT  11/14/2023   KNEE SURGERY     Right Knee   RADIOLOGY WITH ANESTHESIA N/A 10/10/2023   Procedure: CT WITH MICROWAVE OF THE LIVER;  Surgeon: Gilmer Mor, DO;  Location: [facility];  Service: Anesthesiology;  Laterality: N/A;   TONSILLECTOMY     TUBAL LIGATION     WISDOM TOOTH EXTRACTION      Family History  Problem Relation Age of Onset   Heart disease Neg Hx    Prostate cancer Father    Colon cancer Neg Hx    Breast cancer Neg Hx    Hypertension Neg Hx    Diabetes Neg Hx    Hyperlipidemia Neg Hx     Social History   Tobacco Use   Smoking status: Never   Smokeless tobacco: Never  Vaping Use   Vaping status: Never Used  Substance Use Topics   Alcohol use: Yes    Comment: occassional   Drug use: No    ROS   Objective:   Vitals: BP 124/71 (BP Location: Right Arm)   Pulse 66   Temp 97.8 F (36.6 C) (Oral)   Resp 18   Ht 5\' 9"  (1.753 m)   Wt 185 lb (83.9 kg)   SpO2 95%   BMI 27.32 kg/m   Physical Exam Constitutional:      General: She is not in acute distress.    Appearance: Normal appearance. She is  well-developed and normal weight. She is not ill-appearing, toxic-appearing or diaphoretic.  HENT:     Head: Normocephalic and atraumatic.     Right Ear: Tympanic membrane, ear canal and external ear normal. No drainage or tenderness. No middle ear effusion. There is no impacted cerumen. Tympanic membrane is not erythematous or bulging.     Left Ear: Tympanic membrane, ear canal and external ear normal. No drainage or tenderness.  No middle ear effusion. There is no impacted cerumen. Tympanic membrane is not erythematous or bulging.     Nose: Congestion present. No rhinorrhea.     Mouth/Throat:     Mouth: Mucous membranes are moist. No oral lesions.     Pharynx: No pharyngeal swelling, oropharyngeal exudate, posterior oropharyngeal erythema or uvula swelling.     Tonsils: No tonsillar exudate or tonsillar abscesses.  Eyes:     General: No  scleral icterus.       Right eye: No discharge.        Left eye: No discharge.     Extraocular Movements: Extraocular movements intact.     Right eye: Normal extraocular motion.     Left eye: Normal extraocular motion.     Conjunctiva/sclera: Conjunctivae normal.  Cardiovascular:     Rate and Rhythm: Normal rate and regular rhythm.     Heart sounds: Normal heart sounds. No murmur heard.    No friction rub. No gallop.  Pulmonary:     Effort: Pulmonary effort is normal. No respiratory distress.     Breath sounds: No stridor. Rhonchi (trace over mid lung fields bilaterally) present. No wheezing or rales.  Chest:     Chest wall: No tenderness.  Musculoskeletal:     Cervical back: Normal range of motion and neck supple.  Lymphadenopathy:     Cervical: No cervical adenopathy.  Skin:    General: Skin is warm and dry.  Neurological:     General: No focal deficit present.     Mental Status: She is alert and oriented to person, place, and time.  Psychiatric:        Mood and Affect: Mood normal.        Behavior: Behavior normal.    IM Depo-Medrol 80 mg administered in clinic.  Assessment and Plan :   PDMP not reviewed this encounter.  1. Sinobronchitis   2. Allergic rhinitis, unspecified seasonality, unspecified trigger    IM steroids as above.  Recommend supportive care otherwise.  Deferred imaging for now given only trace rhonchi over mid lung fields.  Will defer respiratory testing given timeline of illness.  Counseled patient on potential for adverse effects with medications prescribed/recommended today, ER and return-to-clinic precautions discussed, patient verbalized understanding.    Wallis Bamberg, [place] 01/12/24 [phone]

## 2024-02-07 ENCOUNTER — Ambulatory Visit (HOSPITAL_COMMUNITY)
Admission: RE | Admit: 2024-02-07 | Discharge: 2024-02-07 | Disposition: A | Discharge status: Home / Self Care | Source: Ambulatory Visit | Attending: Nurse Practitioner | Admitting: Nurse Practitioner

## 2024-02-07 ENCOUNTER — Other Ambulatory Visit: Payer: Self-pay

## 2024-02-07 ENCOUNTER — Encounter (HOSPITAL_COMMUNITY): Payer: Self-pay

## 2024-02-07 ENCOUNTER — Inpatient Hospital Stay: Payer: BC Managed Care – PPO | Attending: Nurse Practitioner

## 2024-02-07 DIAGNOSIS — K8020 Calculus of gallbladder without cholecystitis without obstruction: Secondary | ICD-10-CM | POA: Insufficient documentation

## 2024-02-07 DIAGNOSIS — C7B02 Secondary carcinoid tumors of liver: Secondary | ICD-10-CM | POA: Diagnosis not present

## 2024-02-07 DIAGNOSIS — I700 Atherosclerosis of aorta: Secondary | ICD-10-CM | POA: Diagnosis not present

## 2024-02-07 DIAGNOSIS — C7B00 Secondary carcinoid tumors, unspecified site: Secondary | ICD-10-CM

## 2024-02-07 DIAGNOSIS — E538 Deficiency of other specified B group vitamins: Secondary | ICD-10-CM | POA: Insufficient documentation

## 2024-02-07 DIAGNOSIS — K5730 Diverticulosis of large intestine without perforation or abscess without bleeding: Secondary | ICD-10-CM | POA: Insufficient documentation

## 2024-02-07 DIAGNOSIS — R197 Diarrhea, unspecified: Secondary | ICD-10-CM | POA: Insufficient documentation

## 2024-02-07 DIAGNOSIS — C7A019 Malignant carcinoid tumor of the small intestine, unspecified portion: Secondary | ICD-10-CM | POA: Insufficient documentation

## 2024-02-07 DIAGNOSIS — E3400 Carcinoid syndrome, unspecified: Secondary | ICD-10-CM | POA: Insufficient documentation

## 2024-02-07 DIAGNOSIS — C7A022 Malignant carcinoid tumor of the ascending colon: Secondary | ICD-10-CM | POA: Insufficient documentation

## 2024-02-07 LAB — CMP (CANCER CENTER ONLY)
ALT: 20 U/L (ref 0–44)
AST: 19 U/L (ref 15–41)
Albumin: 4.1 g/dL (ref 3.5–5.0)
Alkaline Phosphatase: 77 U/L (ref 38–126)
Anion gap: 5 (ref 5–15)
BUN: 23 mg/dL (ref 8–23)
CO2: 28 mmol/L (ref 22–32)
Calcium: 9.2 mg/dL (ref 8.9–10.3)
Chloride: 107 mmol/L (ref 98–111)
Creatinine: 0.96 mg/dL (ref 0.44–1.00)
GFR, Estimated: >60 mL/min (ref >60)
Glucose, Bld: 107 mg/dL — ABNORMAL HIGH (ref 70–99)
Potassium: 4.3 mmol/L (ref 3.5–5.1)
Sodium: 140 mmol/L (ref 135–145)
Total Bilirubin: 0.4 mg/dL (ref 0.0–1.2)
Total Protein: 7.1 g/dL (ref 6.5–8.1)

## 2024-02-07 LAB — CBC WITH DIFFERENTIAL (CANCER CENTER ONLY)
Abs Immature Granulocytes: 0.02 10*3/uL (ref 0.00–0.07)
Basophils Absolute: 0.1 10*3/uL (ref 0.0–0.1)
Basophils Relative: 1 %
Eosinophils Absolute: 0.1 10*3/uL (ref 0.0–0.5)
Eosinophils Relative: 2 %
HCT: 40.5 % (ref 36.0–46.0)
Hemoglobin: 13.6 g/dL (ref 12.0–15.0)
Immature Granulocytes: 0 %
Lymphocytes Relative: 33 %
Lymphs Abs: 1.9 10*3/uL (ref 0.7–4.0)
MCH: 32.2 pg (ref 26.0–34.0)
MCHC: 33.6 g/dL (ref 30.0–36.0)
MCV: 96 fL (ref 80.0–100.0)
Monocytes Absolute: 0.6 10*3/uL (ref 0.1–1.0)
Monocytes Relative: 10 %
Neutro Abs: 3.1 10*3/uL (ref 1.7–7.7)
Neutrophils Relative %: 54 %
Platelet Count: 168 10*3/uL (ref 150–400)
RBC: 4.22 MIL/uL (ref 3.87–5.11)
RDW: 13.3 % (ref 11.5–15.5)
WBC Count: 5.7 10*3/uL (ref 4.0–10.5)
nRBC: 0 % (ref 0.0–0.2)

## 2024-02-07 MED ORDER — SODIUM CHLORIDE (PF) 0.9 % IJ SOLN
INTRAMUSCULAR | Status: AC
  Filled 2024-02-07: qty 50

## 2024-02-07 MED ORDER — IOHEXOL 300 MG/ML  SOLN
100.0000 mL | Freq: Once | INTRAMUSCULAR | Status: AC | PRN
  Administered 2024-02-07: 100 mL via INTRAVENOUS

## 2024-02-11 ENCOUNTER — Ambulatory Visit (INDEPENDENT_AMBULATORY_CARE_PROVIDER_SITE_OTHER): Payer: BC Managed Care – PPO

## 2024-02-11 DIAGNOSIS — E538 Deficiency of other specified B group vitamins: Secondary | ICD-10-CM

## 2024-02-11 LAB — CHROMOGRANIN A: Chromogranin A (ng/mL): 91.5 ng/mL (ref 0.0–101.8)

## 2024-02-11 MED ORDER — CYANOCOBALAMIN 1000 MCG/ML IJ SOLN
1000.0000 ug | Freq: Once | INTRAMUSCULAR | Status: AC
  Administered 2024-02-11: 1000 ug via INTRAMUSCULAR

## 2024-02-11 NOTE — Progress Notes (Signed)
 Patient visits today for their b-12 injection. Patient informed of what they had received and tolerated injection well. Patient notified to reach out to office if needed.

## 2024-02-13 NOTE — Assessment & Plan Note (Signed)
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 Almira Armour at [facility]; - Path showed well-differentiated NET of the small bowel and appendix, with metastatic mesenteric nodule and bilateral ovarian involvement, stage pT4, N1, M1. Went on observation  -04/2014 liver metastasis on MRI, showing multiple lesions suspicious for metastatic NET.  Symptoms of flushing and diarrhea were present. Continued observation until carcinoid syndrome worsened  -04/2016 began first-line octreotide 20 mg, lost f/up 2018 - 07/2017 off sandostatin. Sx worsened  -S/p Lutathera x4 (08/23/17, 10/18/17, 2/29/19, and 02/14/18 + sandostatin) and continued sandostatin.   -Relocated to [place]  11/2018, followed by [facility] where she continued monthly Sandostatin - 01/13/2021, d/c'd per pt due to lack of efficacy  -2nd opinion at [facility], continued supportive care -Began lanreotide 08/2021 and retreated with Lutathera 08/2021 - 02/10/22 x 3 doses; dose held due to poor tolerance (fatigue and nausea).  -Last lanreotide 01/2022 -Hospitalized 04/2023 for viral meningitis, recovered well -05/31/23: Chromogranin A and 5HIAA urine normal, started Xermelo for diarrhea -06/28/23: Ms. Orlov appears well. Weight is stable. Diarrhea slightly improved on Xermelo, I recommend to add lomotil for additional results.  -dotatate PET scan shows 2 hypermetabolic liver lesions c/w metastatic NET. Still waiting on images from [facility] (disc has been requested), but per the report from last PET scan in 2022 this is improvement.  -Given that she remains symptomatic with carcinoid syndrome diarrhea, referred to IR s/p ablation 10/10/23.   -continue supportive care

## 2024-02-14 ENCOUNTER — Encounter: Payer: Self-pay | Admitting: Hematology

## 2024-02-14 ENCOUNTER — Inpatient Hospital Stay (HOSPITAL_BASED_OUTPATIENT_CLINIC_OR_DEPARTMENT_OTHER): Payer: BC Managed Care – PPO | Admitting: Hematology

## 2024-02-14 VITALS — BP 140/68 | HR 61 | Temp 97.8°F | Resp 17 | Wt 191.0 lb

## 2024-02-14 DIAGNOSIS — C7B02 Secondary carcinoid tumors of liver: Secondary | ICD-10-CM | POA: Diagnosis not present

## 2024-02-14 DIAGNOSIS — C7B8 Other secondary neuroendocrine tumors: Secondary | ICD-10-CM | POA: Diagnosis not present

## 2024-02-14 DIAGNOSIS — C7A022 Malignant carcinoid tumor of the ascending colon: Secondary | ICD-10-CM | POA: Diagnosis present

## 2024-02-14 DIAGNOSIS — E3400 Carcinoid syndrome, unspecified: Secondary | ICD-10-CM | POA: Diagnosis not present

## 2024-02-14 DIAGNOSIS — E538 Deficiency of other specified B group vitamins: Secondary | ICD-10-CM | POA: Diagnosis not present

## 2024-02-14 DIAGNOSIS — R197 Diarrhea, unspecified: Secondary | ICD-10-CM | POA: Diagnosis not present

## 2024-02-14 NOTE — Progress Notes (Signed)
 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 Flonnie Hailstone at [facility]; - Path showed well-differentiated NET of the small bowel and appendix, with metastatic mesenteric nodule and bilateral ovarian involvement, stage pT4, N1, M1. Went on observation  -04/2014 liver metastasis on MRI, showing multiple lesions suspicious for metastatic NET.  Symptoms of flushing and diarrhea were present. Continued observation until carcinoid syndrome worsened  -04/2016 began first-line octreotide 20 mg, lost f/up 2018 - 07/2017 off sandostatin. Sx worsened  -S/p Lutathera x4 (08/23/17, 10/18/17, 2/29/19, and 02/14/18 + sandostatin) and continued sandostatin.   -Relocated to [place] 11/2018, followed by [facility] where she continued monthly Sandostatin - 01/13/2021, d/c'd per pt due to lack of efficacy  -2nd opinion at [facility], continued supportive care -Began lanreotide 08/2021 and retreated with Lutathera 08/2021 - 02/10/22 x 3 doses; dose held due to poor tolerance (fatigue and nausea).  -Last lanreotide 01/2022 -Hospitalized 04/2023 for viral meningitis, recovered well -05/31/23: Chromogranin A and 5HIAA urine normal, started Xermelo for diarrhea -06/28/23: Ms. Dombrosky appears well. Weight is stable. Diarrhea slightly improved on Xermelo, I recommend  to add lomotil for additional results.  -dotatate PET scan shows 2 hypermetabolic liver lesions c/w metastatic NET. Still [facility] on images [facility] (disc has been requested), but per the report from last PET scan in 2022 this is improvement.  -Given that she remains symptomatic with carcinoid syndrome diarrhea, referred to IR s/p ablation 10/10/23.   -continue supportive care   Assessment & Plan Neuroendocrine tumor She has a long-standing neuroendocrine tumor primarily affecting the liver, with metastasis to the liver and possibly the lungs. The tumor has been resistant to treatments, including ablation and octreotide injections, which have not controlled symptoms or tumor growth. Recent CT scan shows persistent tumor presence, and chromogranin levels are slightly elevated but within normal range. She experiences persistent diarrhea, erratic since the last procedure. Afinitor, an oral biological agent, is discussed as a potential treatment to stabilize the tumor and control symptoms. She is interested in understanding more about Afinitor and its side effects, including potential mild cytopenias and abnormal liver function. Afinitor is not a chemotherapy agent but a biological agent, shown to control disease compared to no treatment. The alternative, Cubicin, has more side effects and does not prolong life, so it is not recommended. She is concerned about immune system impact, as Afinitor may slightly compromise it by lowering blood counts. - Provide literature on Afinitor for review. - Refer to Dr. Flonnie Hailstone at [facility] for evaluation of potential liver surgery. - Monitor blood counts and liver function every 3-4 months if Afinitor is initiated. - Schedule follow-up appointment in four months.  Diarrhea She experiences erratic diarrhea, persistent since the last procedure. Lomotil is used as needed with some improvement. The diarrhea is related to the neuroendocrine tumor and its treatments. Imodium is  suggested for additional symptom control. - Continue  using Lomotil as needed for diarrhea management. - Consider Imodium for additional symptom control.  Weight management She is concerned about weight gain, currently weighing 191 pounds, and desires weight loss for health improvement. Despite a balanced diet and active lifestyle, she struggles with weight loss, attributing some issues to the neuroendocrine tumor and its treatments. She is advised to follow up with her primary care physician for weight management strategies. - Follow up with primary care physician for weight management.  Vitamin B12 deficiency She has low B12 levels managed with B12 injections, reporting improvement in symptoms such as hair loss and fatigue. She receives B12 injections at an external facility and takes oral B12 supplements intermittently. - Continue B12 injections at the current facility. - Continue oral B12 supplementation as needed.   Plan - She will continue Lomotil as needed for diarrhea, her insurance does not cover Xermelo  anymore - We discussed treatment options, especially Afinitor, she wants to think about it. - Will refer her to [facility] dietitian to consider liver surgery -lab and f/u in 4 months   SUMMARY OF ONCOLOGIC HISTORY: Oncology History  Metastatic carcinoid tumor (HCC)  11/2012 Initial Diagnosis   Right hemicolectomy and bilateral salpingo-oophorectomy: 3 cm grade 2 well-differentiated neuroendocrine tumor lymphovascular invasion and perineural invasion present, 6/17 lymph nodes positive, 3 mitosis per 10 high-power fields Ki-67 2 to 5% appendix positive, small bowel mesenteric nodule positive, right and left ovaries positive T4 N1 M1   03/2014 Imaging   Liver metastases   04/2016 Imaging   Dotatate PET: Left subdiaphragmatic lesion 1 cm   04/2016 Miscellaneous   Sandostatin LAR continued till October 2019 and then she was lost to follow-up   08/07/2017 Imaging    Redemonstration of multiple dotatate avid lesions in the liver suggestive of residual neuroendocrine metastases uptake has decreased   08/23/2017 Miscellaneous   Until a 02/14/2018 4 doses of Lutathera given, Sandostatin continued (all the above care was done at [facility] and then transferred her care to [facility] who started on Telotristat Ethyl for diarrhea orally   08/17/2018 Imaging   Dotatate PET: Similar distribution liver and left phrenic lymph node mild uptake in right supraclavicular lymph node, 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  History of Present Illness The patient is a 77 year old female with a known history of neuroendocrine tumor, who presents with concerns about her inability to lose weight and persistent diarrhea. She reports a recent weight gain, despite dietary modifications and increased physical activity. She denies eating junk food and fried foods, and instead opts for healthier options like lean meats, fish, and vegetables. She also mentions that she has a physically demanding job that requires her to be active and outdoors. Despite these efforts, she has not been successful in losing weight and expresses frustration about this.  In addition to her weight concerns, the patient also reports persistent diarrhea. She describes it as being "erratic" and mentions that it has been a problem since her surgery for the neuroendocrine tumor. She has been managing her symptoms with Lomotil, which she takes as needed. She also mentions experiencing fatigue, dry mouth, and a constant feeling of being "wired".  She is currently receiving B12 injections for low  B12 levels, which she believes has helped with her hair loss but not her fatigue.  The patient has a history of surgery for her neuroendocrine tumor and has undergone two rounds of radiotherapy. She is not currently on any tumor-specific treatment. She expresses a desire to manage her symptoms better and improve her quality of life.     All other systems were reviewed with the patient and are negative.  MEDICAL HISTORY:  Past Medical History:  Diagnosis Date   Anxiety    Degenerative disc disease    Depression    Insomnia    Neuroendocrine cancer (HCC)    Osteoarthritis     SURGICAL HISTORY: Past Surgical History:  Procedure Laterality Date   APPENDECTOMY     IR RADIOLOGIST EVAL & MGMT  07/09/2023   IR RADIOLOGIST EVAL & MGMT  11/14/2023   KNEE SURGERY     Right Knee   RADIOLOGY WITH ANESTHESIA N/A 10/10/2023   Procedure: CT WITH MICROWAVE OF THE LIVER;  Surgeon: Myrlene Asper, DO;  Location: [facility];  Service: Anesthesiology;  Laterality: N/A;   TONSILLECTOMY     TUBAL LIGATION     WISDOM TOOTH EXTRACTION      I have reviewed the social history and family history with the patient and they are unchanged from previous note.  ALLERGIES:  is allergic to astemizole, penicillins, prochlorperazine, prochlorperazine edisylate, and tolectin [tolmetin sodium].  MEDICATIONS:  Current Outpatient Medications  Medication Sig Dispense Refill   cyanocobalamin (VITAMIN B12) 1000 MCG tablet Take 1,000 mcg by mouth 3 (three) times a week.     cyanocobalamin (VITAMIN B12) 1000 MCG tablet Take 1,000 mcg by mouth daily.     diphenoxylate-atropine (LOMOTIL) 2.5-0.025 MG tablet Take 1-2 tablets by mouth 4 (four) times daily as needed for diarrhea or loose stools. 60 tablet 0   Telotristat Ethyl,as Etiprate, (XERMELO) 250 MG TABS Take 1 tablet (250 mg total) by mouth 3 (three) times daily. Take with food. 84 tablet 1   Current Facility-Administered Medications  Medication Dose Route Frequency  Provider Last Rate Last Admin   cyanocobalamin (VITAMIN B12) injection 1,000 mcg  1,000 mcg Intramuscular Once         PHYSICAL EXAMINATION: ECOG PERFORMANCE STATUS: 1 - Symptomatic but completely ambulatory  Vitals:   02/14/24 0802  BP: (!) 140/68  Pulse: 61  Resp: 17  Temp: 97.8 F (36.6 C)  SpO2: 99%   Wt Readings from Last 3 Encounters:  02/14/24 191 lb (86.6 kg)  01/12/24 185 lb (83.9 kg)  11/14/23 188 lb 1.6 oz (85.3 kg)     GENERAL:alert, no distress and comfortable SKIN: skin color, texture, turgor are normal, no rashes or significant lesions EYES: normal, Conjunctiva are pink and non-injected, sclera clear NECK: supple, thyroid normal size, non-tender, without nodularity LYMPH:  no palpable lymphadenopathy in the cervical, axillary  LUNGS: clear to auscultation and percussion with normal breathing effort HEART: regular rate & rhythm and no murmurs and no lower extremity edema ABDOMEN:abdomen soft, non-tender and normal bowel sounds Musculoskeletal:no cyanosis of digits and no clubbing  NEURO: alert & oriented x 3 with fluent speech, no focal motor/sensory deficits  Physical Exam   LABORATORY DATA:  I have reviewed the data as listed    Latest Ref Rng & Units 02/07/2024    8:14 AM 09/26/2023   10:39 AM 08/09/2023   11:24 AM  CBC  WBC 4.0 - 10.5 K/uL 5.7  5.2  5.4   Hemoglobin 12.0 - 15.0 g/dL 16.1  09.6  04.5   Hematocrit 36.0 - 46.0 % 40.5  38.3  40.9   Platelets 150 - 400 K/uL 168  173  194.0         Latest Ref Rng & Units 02/07/2024    8:14 AM 09/26/2023   10:39 AM 05/31/2023    9:35 AM  CMP  Glucose 70 - 99 mg/dL 409  811  914   BUN 8 - 23 mg/dL 23  22  15    Creatinine 0.44 - 1.00 mg/dL 7.82  9.56  2.13   Sodium 135 - 145 mmol/L 140  137  140   Potassium 3.5 - 5.1 mmol/L 4.3  4.2  4.2   Chloride 98 - 111 mmol/L 107  104  107   CO2 22 - 32 mmol/L 28  24  24    Calcium 8.9 - 10.3 mg/dL 9.2  9.4  9.2   Total Protein 6.5 - 8.1 g/dL 7.1  7.4  7.1    Total Bilirubin 0.0 - 1.2 mg/dL 0.4  0.8  0.7   Alkaline Phos 38 - 126 U/L 77  77  71   AST 15 - 41 U/L 19  23  19    ALT 0 - 44 U/L 20  23  21        RADIOGRAPHIC STUDIES: I have personally reviewed the radiological images as listed and agreed with the findings in the report. No results found.    Orders Placed This Encounter  Procedures   Ambulatory referral to General Surgery    Referral Priority:   Routine    Referral Type:   Surgical    Referral Reason:   Specialty Services Required    Requested Specialty:   General Surgery    Number of Visits Requested:   1   All questions were answered. The patient knows to call the clinic with any problems, questions or concerns. No barriers to learning was detected. The total time spent in the appointment was 30 minutes.     Sonja [place], MD 02/14/2024

## 2024-02-18 ENCOUNTER — Other Ambulatory Visit: Payer: Self-pay

## 2024-02-18 NOTE — Progress Notes (Signed)
 Referral packet for surgery faxed to Dr. Kirk Peper at [facility] hospital.  Fax confirmation received.  Ms Annis Baseman in Radiology pushed all images for past 1.5 yrs over to [employer] so they can be seen in EPIC at [facility].

## 2024-02-19 ENCOUNTER — Other Ambulatory Visit: Payer: Self-pay | Admitting: Interventional Radiology

## 2024-02-19 DIAGNOSIS — C7B8 Other secondary neuroendocrine tumors: Secondary | ICD-10-CM

## 2024-02-27 ENCOUNTER — Ambulatory Visit
Admission: RE | Admit: 2024-02-27 | Discharge: 2024-02-27 | Disposition: A | Discharge status: Home / Self Care | Source: Ambulatory Visit | Attending: Interventional Radiology | Admitting: Interventional Radiology

## 2024-02-27 DIAGNOSIS — C7B8 Other secondary neuroendocrine tumors: Secondary | ICD-10-CM

## 2024-03-07 ENCOUNTER — Other Ambulatory Visit (INDEPENDENT_AMBULATORY_CARE_PROVIDER_SITE_OTHER)

## 2024-03-07 DIAGNOSIS — E785 Hyperlipidemia, unspecified: Secondary | ICD-10-CM | POA: Diagnosis not present

## 2024-03-07 DIAGNOSIS — L659 Nonscarring hair loss, unspecified: Secondary | ICD-10-CM

## 2024-03-07 LAB — LIPID PANEL
Cholesterol: 186 mg/dL (ref 0–200)
HDL: 64.2 mg/dL (ref >39.00)
LDL Cholesterol: 103 mg/dL — ABNORMAL HIGH (ref 0–99)
NonHDL: 121.81
Total CHOL/HDL Ratio: 3
Triglycerides: 95 mg/dL (ref 0.0–149.0)
VLDL: 19 mg/dL (ref 0.0–40.0)

## 2024-03-07 LAB — COMPREHENSIVE METABOLIC PANEL WITH GFR
ALT: 17 U/L (ref 0–35)
AST: 18 U/L (ref 0–37)
Albumin: 4 g/dL (ref 3.5–5.2)
Alkaline Phosphatase: 70 U/L (ref 39–117)
BUN: 22 mg/dL (ref 6–23)
CO2: 27 meq/L (ref 19–32)
Calcium: 9 mg/dL (ref 8.4–10.5)
Chloride: 106 meq/L (ref 96–112)
Creatinine, Ser: 0.89 mg/dL (ref 0.40–1.20)
GFR: 63.1 mL/min (ref >60.00)
Glucose, Bld: 104 mg/dL — ABNORMAL HIGH (ref 70–99)
Potassium: 4.2 meq/L (ref 3.5–5.1)
Sodium: 141 meq/L (ref 135–145)
Total Bilirubin: 0.5 mg/dL (ref 0.2–1.2)
Total Protein: 6.6 g/dL (ref 6.0–8.3)

## 2024-03-07 LAB — VITAMIN B12: Vitamin B-12: 639 pg/mL (ref 211–911)

## 2024-03-14 ENCOUNTER — Ambulatory Visit: Payer: BC Managed Care – PPO | Admitting: Nurse Practitioner

## 2024-03-14 ENCOUNTER — Other Ambulatory Visit: Payer: Self-pay | Admitting: Nurse Practitioner

## 2024-03-14 VITALS — BP 126/62 | HR 61 | Temp 98.1°F | Ht 69.0 in | Wt 188.2 lb

## 2024-03-14 DIAGNOSIS — I10 Essential (primary) hypertension: Secondary | ICD-10-CM | POA: Diagnosis not present

## 2024-03-14 DIAGNOSIS — E663 Overweight: Secondary | ICD-10-CM

## 2024-03-14 DIAGNOSIS — E785 Hyperlipidemia, unspecified: Secondary | ICD-10-CM | POA: Diagnosis not present

## 2024-03-14 DIAGNOSIS — E538 Deficiency of other specified B group vitamins: Secondary | ICD-10-CM

## 2024-03-14 DIAGNOSIS — R053 Chronic cough: Secondary | ICD-10-CM | POA: Insufficient documentation

## 2024-03-14 DIAGNOSIS — R5383 Other fatigue: Secondary | ICD-10-CM

## 2024-03-14 MED ORDER — CYANOCOBALAMIN 1000 MCG/ML IJ SOLN
1000.0000 ug | Freq: Once | INTRAMUSCULAR | Status: AC
  Administered 2024-03-14: 1000 ug via INTRAMUSCULAR

## 2024-03-14 NOTE — Assessment & Plan Note (Signed)
 Patient would like to lose 20lbs. I recommend she focus on diet and exercise. She is very active, but is not counting calories. Recommend she keep a food diary with daily caolorie goal of 1100-1500 calories per day.

## 2024-03-14 NOTE — Assessment & Plan Note (Signed)
 Chornic, much improved.  Administer B12 IM injection x 1 then continue with daily oral supplement.

## 2024-03-14 NOTE — Progress Notes (Signed)
   Established Patient Office Visit  Subjective   Patient ID: Anna Cortez, female    DOB: 1946/09/10  Age: 78 y.o. MRN: [id number]  Chief Complaint  Patient presents with   Hyperlipidemia    Vitamin B12 deficiency: Has completed 3 doses of vitamin B12 via IM injection. Last serum level was 639.   HLD/HTN: Diet controlled, not on cholesterol lowering medication not on antihypertensive. Last LDL 103.     Review of Systems  Respiratory:  Negative for shortness of breath.   Cardiovascular:  Negative for chest pain and palpitations.      Objective:     BP 126/62   Pulse 61   Temp 98.1 F (36.7 C) (Temporal)   Ht 5\' 9"  (1.753 m)   Wt 188 lb 4 oz (85.4 kg)   SpO2 97%   BMI 27.80 kg/m  BP Readings from Last 3 Encounters:  03/14/24 126/62  02/14/24 (!) 140/68  01/12/24 124/71   Wt Readings from Last 3 Encounters:  03/14/24 188 lb 4 oz (85.4 kg)  02/14/24 191 lb (86.6 kg)  01/12/24 185 lb (83.9 kg)      Physical Exam Vitals reviewed.  Constitutional:      General: She is not in acute distress.    Appearance: Normal appearance.  HENT:     Head: Normocephalic and atraumatic.  Neck:     Vascular: No carotid bruit.  Cardiovascular:     Rate and Rhythm: Normal rate and regular rhythm.     Pulses: Normal pulses.     Heart sounds: Normal heart sounds.  Pulmonary:     Effort: Pulmonary effort is normal.     Breath sounds: Normal breath sounds.  Skin:    General: Skin is warm and dry.  Neurological:     General: No focal deficit present.     Mental Status: She is alert and oriented to person, place, and time.  Psychiatric:        Mood and Affect: Mood normal.        Behavior: Behavior normal.        Judgment: Judgment normal.      No results found for any visits on 03/14/24.    The 10-year ASCVD risk score (Arnett DK, et al., 2019) is: 17.4%    Assessment & Plan:   Problem List Items Addressed This Visit       Cardiovascular and Mediastinum    Essential hypertension - Primary   Chronic, stable, well controlled Diet controlled  No need to start antihypertensive at this time.         Other   Hyperlipidemia   Chronic LDL has dropped about 20 points with dietary changes. Patient prefers to continue off of cholesterol medications.       Overweight   Patient would like to lose 20lbs. I recommend she focus on diet and exercise. She is very active, but is not counting calories. Recommend she keep a food diary with daily caolorie goal of 1100-1500 calories per day.       B12 deficiency   Chornic, much improved.  Administer B12 IM injection x 1 then continue with daily oral supplement.        No follow-ups on file.    Zorita Hiss, NP

## 2024-03-14 NOTE — Addendum Note (Signed)
 Addended by: Arch Beans, Chelsea Cordia on: 03/14/2024 04:38 PM   Modules accepted: Orders

## 2024-03-14 NOTE — Assessment & Plan Note (Signed)
 Chronic LDL has dropped about 20 points with dietary changes. Patient prefers to continue off of cholesterol medications.

## 2024-03-14 NOTE — Assessment & Plan Note (Signed)
 Chronic, stable, well controlled Diet controlled  No need to start antihypertensive at this time.

## 2024-05-12 ENCOUNTER — Telehealth: Payer: Self-pay

## 2024-05-12 ENCOUNTER — Other Ambulatory Visit: Payer: Self-pay | Admitting: Hematology

## 2024-05-12 DIAGNOSIS — C7B8 Other secondary neuroendocrine tumors: Secondary | ICD-10-CM

## 2024-05-12 NOTE — Telephone Encounter (Addendum)
 Patient notified of provider's comments below. Patient verbalized understanding. Patient stated she would stop by the facility to pickup container at her earliest convenience.   ----- Message from [facility] sent at 05/12/2024 11:14 AM EDT ----- Please let pt know that she needs 24hr urine test, order placed, let her come in to pick up the container, thx

## 2024-05-19 ENCOUNTER — Other Ambulatory Visit: Payer: Self-pay

## 2024-05-19 ENCOUNTER — Inpatient Hospital Stay: Attending: Hematology

## 2024-05-19 DIAGNOSIS — C7A022 Malignant carcinoid tumor of the ascending colon: Secondary | ICD-10-CM | POA: Insufficient documentation

## 2024-05-19 DIAGNOSIS — C7B8 Other secondary neuroendocrine tumors: Secondary | ICD-10-CM

## 2024-05-19 DIAGNOSIS — C7B02 Secondary carcinoid tumors of liver: Secondary | ICD-10-CM | POA: Diagnosis not present

## 2024-05-21 LAB — 5 HIAA, QUANTITATIVE, URINE, 24 HOUR
5-HIAA, Ur: 2.6 mg/L
5-HIAA,Quant.,24 Hr Urine: 6 mg/(24.h) (ref 0.0–14.9)
Total Volume: 2300

## 2024-06-12 ENCOUNTER — Inpatient Hospital Stay: Attending: Hematology

## 2024-06-12 ENCOUNTER — Encounter: Payer: Self-pay | Admitting: Hematology

## 2024-06-12 ENCOUNTER — Inpatient Hospital Stay (HOSPITAL_BASED_OUTPATIENT_CLINIC_OR_DEPARTMENT_OTHER): Admitting: Hematology

## 2024-06-12 VITALS — BP 138/72 | HR 59 | Temp 98.9°F | Resp 17 | Wt 189.9 lb

## 2024-06-12 DIAGNOSIS — E538 Deficiency of other specified B group vitamins: Secondary | ICD-10-CM | POA: Insufficient documentation

## 2024-06-12 DIAGNOSIS — C7B02 Secondary carcinoid tumors of liver: Secondary | ICD-10-CM | POA: Insufficient documentation

## 2024-06-12 DIAGNOSIS — C7B00 Secondary carcinoid tumors, unspecified site: Secondary | ICD-10-CM

## 2024-06-12 DIAGNOSIS — E3400 Carcinoid syndrome, unspecified: Secondary | ICD-10-CM | POA: Insufficient documentation

## 2024-06-12 DIAGNOSIS — C7A022 Malignant carcinoid tumor of the ascending colon: Secondary | ICD-10-CM | POA: Diagnosis present

## 2024-06-12 DIAGNOSIS — R197 Diarrhea, unspecified: Secondary | ICD-10-CM | POA: Diagnosis not present

## 2024-06-12 DIAGNOSIS — C7B8 Other secondary neuroendocrine tumors: Secondary | ICD-10-CM

## 2024-06-12 LAB — CBC WITH DIFFERENTIAL (CANCER CENTER ONLY)
Abs Immature Granulocytes: 0.02 K/uL (ref 0.00–0.07)
Basophils Absolute: 0.1 K/uL (ref 0.0–0.1)
Basophils Relative: 1 %
Eosinophils Absolute: 0.1 K/uL (ref 0.0–0.5)
Eosinophils Relative: 2 %
HCT: 38.4 % (ref 36.0–46.0)
Hemoglobin: 13.3 g/dL (ref 12.0–15.0)
Immature Granulocytes: 0 %
Lymphocytes Relative: 38 %
Lymphs Abs: 1.7 K/uL (ref 0.7–4.0)
MCH: 33.3 pg (ref 26.0–34.0)
MCHC: 34.6 g/dL (ref 30.0–36.0)
MCV: 96 fL (ref 80.0–100.0)
Monocytes Absolute: 0.4 K/uL (ref 0.1–1.0)
Monocytes Relative: 9 %
Neutro Abs: 2.2 K/uL (ref 1.7–7.7)
Neutrophils Relative %: 50 %
Platelet Count: 155 K/uL (ref 150–400)
RBC: 4 MIL/uL (ref 3.87–5.11)
RDW: 12.4 % (ref 11.5–15.5)
WBC Count: 4.5 K/uL (ref 4.0–10.5)
nRBC: 0 % (ref 0.0–0.2)

## 2024-06-12 LAB — CMP (CANCER CENTER ONLY)
ALT: 16 U/L (ref 0–44)
AST: 17 U/L (ref 15–41)
Albumin: 3.9 g/dL (ref 3.5–5.0)
Alkaline Phosphatase: 69 U/L (ref 38–126)
Anion gap: 6 (ref 5–15)
BUN: 19 mg/dL (ref 8–23)
CO2: 26 mmol/L (ref 22–32)
Calcium: 8.8 mg/dL — ABNORMAL LOW (ref 8.9–10.3)
Chloride: 108 mmol/L (ref 98–111)
Creatinine: 0.96 mg/dL (ref 0.44–1.00)
GFR, Estimated: >60 mL/min (ref >60)
Glucose, Bld: 172 mg/dL — ABNORMAL HIGH (ref 70–99)
Potassium: 4.4 mmol/L (ref 3.5–5.1)
Sodium: 140 mmol/L (ref 135–145)
Total Bilirubin: 0.5 mg/dL (ref 0.0–1.2)
Total Protein: 6.4 g/dL — ABNORMAL LOW (ref 6.5–8.1)

## 2024-06-12 NOTE — Progress Notes (Signed)
 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 Debora at [facility]; - Path showed well-differentiated NET of the small bowel and appendix, with metastatic mesenteric nodule and bilateral ovarian involvement, stage pT4, N1, M1. Went on observation  -04/2014 liver metastasis on MRI, showing multiple lesions suspicious for metastatic NET.  Symptoms of flushing and diarrhea were present. Continued observation until carcinoid syndrome worsened  -04/2016 began first-line octreotide 20 mg, lost f/up 2018 - 07/2017 off sandostatin. Sx worsened  -S/p Lutathera x4 (08/23/17, 10/18/17, 2/29/19, and 02/14/18 + sandostatin) and continued sandostatin.   -Relocated to [place]  11/2018, followed by [facility] where she continued monthly Sandostatin - 01/13/2021, d/c'd per pt due to lack of efficacy  -2nd opinion at [facility], continued supportive care -Began lanreotide 08/2021 and retreated with Lutathera 08/2021 - 02/10/22 x 3 doses; dose held due to poor tolerance (fatigue and nausea).  -Last lanreotide 01/2022 -Hospitalized 04/2023 for viral meningitis, recovered well -05/31/23: Chromogranin A and 5HIAA urine normal, started Xermelo for diarrhea -06/28/23: [name] appears well. Weight is stable. Diarrhea slightly improved on Xermelo, I  recommend to add lomotil for additional results.  -dotatate PET scan shows 2 hypermetabolic liver lesions c/w metastatic NET.  -Given that she remains symptomatic with carcinoid syndrome diarrhea, referred to IR s/p ablation 10/10/23.   -repeated CT in 01/2024 showed treatment effect and no residual disease in liver or new lesions, but liver MRI in 02/2024 did show residual disease and new small mets in liver    B12 deficiency -on monthly B12 injections   Assessment & Plan Neuroendocrine tumor with residual hepatic disease, post-ablation Residual hepatic disease post-ablation with stable size of two treated lesions. MRI in June showed persistent restriction diffusion, concerning for recurrent disease. No active disease noted, but small amount of tumor cells still possible. Majority of disease was ablated. Patient not interested in surgery or further invasive procedures at this time. Discussed cabozantinib, a new medication approved for neuroendocrine tumors, with potential GI side effects and risks of bleeding and hypertension. Patient is considering this option but concerned about insurance coverage and side effects. 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 Lomedia prescribed for diarrhea management, but insurance does not cover Zomelo, which was more effective. Discussed potential new medication that may help with symptoms, but patient concerned about GI side effects. Patient experiences fatigue and lack of appetite, possibly related to neuroendocrine tumor. Patient is actively managing her condition with lifestyle adjustments and is considering new medication options. - Continue Lomedia for diarrhea management - Discuss potential new  medication for symptom control if patient is interested - Monitor symptoms and consider treatment if symptoms worsen  Plan - I reviewed her last CT and recent MRI at the [facility], showed residual disease and multiple other small new mets. - I recommended her to consider cabozantinib, benefit and side effects discussed with her, and I will give her a handout, she will think about it and call me back if interested. - Lab and follow-up in 6 months.  Plan to repeat a scan in 1 year.    SUMMARY OF ONCOLOGIC HISTORY: Oncology History  Metastatic carcinoid tumor (HCC)  11/2012 Initial Diagnosis   Right hemicolectomy and bilateral salpingo-oophorectomy: 3 cm grade 2 well-differentiated neuroendocrine tumor lymphovascular invasion and perineural invasion present, 6/17 lymph nodes positive, 3 mitosis per 10 high-power fields Ki-67 2 to 5% appendix positive, small bowel mesenteric nodule positive, right and left ovaries positive T4 N1 M1   03/2014 Imaging   Liver metastases   04/2016 Imaging   Dotatate PET: Left subdiaphragmatic lesion 1 cm   04/2016 Miscellaneous   Sandostatin LAR continued till October 2019 and then she was lost to follow-up   08/07/2017 Imaging   Redemonstration of multiple dotatate avid lesions in the liver suggestive of residual neuroendocrine metastases uptake has decreased   08/23/2017 Miscellaneous   Until a 02/14/2018 4 doses of Lutathera given, Sandostatin continued (all the above care was done at [facility] and then transferred her care to [facility] who started on Telotristat Ethyl for diarrhea orally   08/17/2018 Imaging   Dotatate PET: Similar distribution liver and left phrenic lymph node mild uptake in right supraclavicular lymph node, 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  History of Present Illness [facility] is a 78 year old female with a neuroendocrine tumor who presents for follow-up.  She experiences persistent diarrhea, which has returned despite occasional use of prescription medication. Zomelo was the most effective treatment but is not covered by her insurance. Lomedia is less effective, and the shots are ineffective and costly.  A CT scan in April and an MRI in June were performed to monitor her disease status after liver ablation. She continues to experience fatigue and difficulty sleeping, with a sensation of 'something's welling up inside me' when trying to sleep. She struggles with appetite, eating only two meals a day due to the heat, and has not lost weight despite efforts to do so.  She has received B12 shots for four months, which helped with her fatigue. Her B12 levels were initially low but have normalized with oral supplementation of 1000 mcg daily. She plans to follow up with her primary care physician in November for further evaluation.     All other systems were reviewed with the patient and are negative.  MEDICAL HISTORY:  Past Medical History:  Diagnosis Date   Anxiety    Degenerative disc disease    Depression    Insomnia    Neuroendocrine cancer (HCC)    Osteoarthritis     SURGICAL HISTORY: Past Surgical History:  Procedure Laterality Date   APPENDECTOMY     IR RADIOLOGIST EVAL & MGMT  07/09/2023   IR RADIOLOGIST EVAL & MGMT  11/14/2023   KNEE SURGERY     Right Knee  RADIOLOGY WITH ANESTHESIA N/A 10/10/2023   Procedure: CT WITH MICROWAVE OF THE LIVER;  Surgeon: Alona Corners, DO;  Location: [facility];  Service: Anesthesiology;  Laterality: N/A;   TONSILLECTOMY     TUBAL LIGATION     WISDOM TOOTH EXTRACTION      I have reviewed the social history and family  history with the patient and they are unchanged from previous note.  ALLERGIES:  is allergic to astemizole, penicillins, prochlorperazine, prochlorperazine edisylate, and tolectin [tolmetin sodium].  MEDICATIONS:  Current Outpatient Medications  Medication Sig Dispense Refill   cyanocobalamin (VITAMIN B12) 1000 MCG tablet Take 1,000 mcg by mouth daily.     diphenoxylate-atropine (LOMOTIL) 2.5-0.025 MG tablet Take 1-2 tablets by mouth 4 (four) times daily as needed for diarrhea or loose stools. 60 tablet 0   Current Facility-Administered Medications  Medication Dose Route Frequency Provider Last Rate Last Admin   cyanocobalamin (VITAMIN B12) injection 1,000 mcg  1,000 mcg Intramuscular Once         PHYSICAL EXAMINATION: ECOG PERFORMANCE STATUS: 1 - Symptomatic but completely ambulatory  Vitals:   06/12/24 0818  BP: 138/72  Pulse: (!) 59  Resp: 17  Temp: 98.9 F (37.2 C)  SpO2: 99%   Wt Readings from Last 3 Encounters:  06/12/24 189 lb 14.4 oz (86.1 kg)  03/14/24 188 lb 4 oz (85.4 kg)  02/14/24 191 lb (86.6 kg)     GENERAL:alert, no distress and comfortable SKIN: skin color, texture, turgor are normal, no rashes or significant lesions EYES: normal, Conjunctiva are pink and non-injected, sclera clear  Musculoskeletal:no cyanosis of digits and no clubbing  NEURO: alert & oriented x 3 with fluent speech, no focal motor/sensory deficits  Physical Exam    LABORATORY DATA:  I have reviewed the data as listed    Latest Ref Rng & Units 06/12/2024    7:55 AM 02/07/2024    8:14 AM 09/26/2023   10:39 AM  CBC  WBC 4.0 - 10.5 K/uL 4.5  5.7  5.2   Hemoglobin 12.0 - 15.0 g/dL 86.6  86.3  86.7   Hematocrit 36.0 - 46.0 % 38.4  40.5  38.3   Platelets 150 - 400 K/uL 155  168  173         Latest Ref Rng & Units 06/12/2024    7:55 AM 03/07/2024    7:56 AM 02/07/2024    8:14 AM  CMP  Glucose 70 - 99 mg/dL 827  895  892   BUN 8 - 23 mg/dL 19  22  23    Creatinine 0.44 - 1.00 mg/dL  9.03  9.10  9.03   Sodium 135 - 145 mmol/L 140  141  140   Potassium 3.5 - 5.1 mmol/L 4.4  4.2  4.3   Chloride 98 - 111 mmol/L 108  106  107   CO2 22 - 32 mmol/L 26  27  28    Calcium 8.9 - 10.3 mg/dL 8.8  9.0  9.2   Total Protein 6.5 - 8.1 g/dL 6.4  6.6  7.1   Total Bilirubin 0.0 - 1.2 mg/dL 0.5  0.5  0.4   Alkaline Phos 38 - 126 U/L 69  70  77   AST 15 - 41 U/L 17  18  19    ALT 0 - 44 U/L 16  17  20        RADIOGRAPHIC STUDIES: I have personally reviewed the radiological images as listed and agreed with the findings in the report. No  results found.    No orders of the defined types were placed in this encounter.  All questions were answered. The patient knows to call the clinic with any problems, questions or concerns. No barriers to learning was detected. The total time spent in the appointment was 30 minutes, including review of chart and various tests results, discussions about plan of care and coordination of care plan     Onita Mattock, Anna 06/12/2024

## 2024-06-12 NOTE — Assessment & Plan Note (Addendum)
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 Debora at [facility]; - Path showed well-differentiated NET of the small bowel and appendix, with metastatic mesenteric nodule and bilateral ovarian involvement, stage pT4, N1, M1. Went on observation  -04/2014 liver metastasis on MRI, showing multiple lesions suspicious for metastatic NET.  Symptoms of flushing and diarrhea were present. Continued observation until carcinoid syndrome worsened  -04/2016 began first-line octreotide 20 mg, lost f/up 2018 - 07/2017 off sandostatin. Sx worsened  -S/p Lutathera x4 (08/23/17, 10/18/17, 2/29/19, and 02/14/18 + sandostatin) and continued sandostatin.   -Relocated to [place]  11/2018, followed by [facility] where she continued monthly Sandostatin - 01/13/2021, d/c'd per pt due to lack of efficacy  -2nd opinion at [facility], continued supportive care -Began lanreotide 08/2021 and retreated with Lutathera 08/2021 - 02/10/22 x 3 doses; dose held due to poor tolerance (fatigue and nausea).  -Last lanreotide 01/2022 -Hospitalized 04/2023 for viral meningitis, recovered well -05/31/23: Chromogranin A and 5HIAA urine normal, started Xermelo for diarrhea -06/28/23: Ms. Chamorro appears well. Weight is stable. Diarrhea slightly improved on Xermelo, I recommend to add lomotil for additional results.  -dotatate PET scan shows 2 hypermetabolic liver lesions c/w metastatic NET.  -Given that she remains symptomatic with carcinoid syndrome diarrhea, referred to IR s/p ablation 10/10/23.   -repeated CT in 01/2024 showed treatment effect and no residual disease in liver or new lesions, but liver MRI in 02/2024 did show residual disease and new small mets in liver

## 2024-06-12 NOTE — Assessment & Plan Note (Signed)
-  on monthly B12 injections

## 2024-06-16 LAB — CHROMOGRANIN A: Chromogranin A (ng/mL): 78.8 ng/mL (ref 0.0–101.8)

## 2024-09-10 ENCOUNTER — Encounter: Payer: Self-pay | Admitting: Nurse Practitioner

## 2024-09-10 NOTE — Addendum Note (Signed)
 Addended by: ELNOR DOMINO E on: 09/10/2024 12:55 PM   Modules accepted: Orders

## 2024-09-12 ENCOUNTER — Other Ambulatory Visit

## 2024-09-12 DIAGNOSIS — E538 Deficiency of other specified B group vitamins: Secondary | ICD-10-CM | POA: Diagnosis not present

## 2024-09-12 DIAGNOSIS — E663 Overweight: Secondary | ICD-10-CM | POA: Diagnosis not present

## 2024-09-12 DIAGNOSIS — E785 Hyperlipidemia, unspecified: Secondary | ICD-10-CM | POA: Diagnosis not present

## 2024-09-12 DIAGNOSIS — R5383 Other fatigue: Secondary | ICD-10-CM

## 2024-09-12 DIAGNOSIS — I10 Essential (primary) hypertension: Secondary | ICD-10-CM | POA: Diagnosis not present

## 2024-09-12 LAB — VITAMIN B12: Vitamin B-12: 650 pg/mL (ref 211–911)

## 2024-09-12 LAB — LIPID PANEL
Cholesterol: 200 mg/dL (ref 0–200)
HDL: 61 mg/dL (ref >39.00)
LDL Cholesterol: 104 mg/dL — ABNORMAL HIGH (ref 0–99)
NonHDL: 139.33
Total CHOL/HDL Ratio: 3
Triglycerides: 176 mg/dL — ABNORMAL HIGH (ref 0.0–149.0)
VLDL: 35.2 mg/dL (ref 0.0–40.0)

## 2024-09-12 LAB — COMPREHENSIVE METABOLIC PANEL WITH GFR
ALT: 20 U/L (ref 0–35)
AST: 20 U/L (ref 0–37)
Albumin: 4.2 g/dL (ref 3.5–5.2)
Alkaline Phosphatase: 75 U/L (ref 39–117)
BUN: 20 mg/dL (ref 6–23)
CO2: 27 meq/L (ref 19–32)
Calcium: 9.1 mg/dL (ref 8.4–10.5)
Chloride: 106 meq/L (ref 96–112)
Creatinine, Ser: 1.01 mg/dL (ref 0.40–1.20)
GFR: 54.01 mL/min — ABNORMAL LOW (ref >60.00)
Glucose, Bld: 105 mg/dL — ABNORMAL HIGH (ref 70–99)
Potassium: 4.4 meq/L (ref 3.5–5.1)
Sodium: 142 meq/L (ref 135–145)
Total Bilirubin: 0.7 mg/dL (ref 0.2–1.2)
Total Protein: 7 g/dL (ref 6.0–8.3)

## 2024-09-12 LAB — T3, FREE: T3, Free: 2.9 pg/mL (ref 2.3–4.2)

## 2024-09-12 LAB — CBC
HCT: 41.6 % (ref 36.0–46.0)
Hemoglobin: 14 g/dL (ref 12.0–15.0)
MCHC: 33.7 g/dL (ref 30.0–36.0)
MCV: 98.2 fl (ref 78.0–100.0)
Platelets: 172 K/uL (ref 150.0–400.0)
RBC: 4.23 Mil/uL (ref 3.87–5.11)
RDW: 12.8 % (ref 11.5–15.5)
WBC: 5.3 K/uL (ref 4.0–10.5)

## 2024-09-12 LAB — T4, FREE: Free T4: 0.5 ng/dL — ABNORMAL LOW (ref 0.60–1.60)

## 2024-09-12 LAB — IRON: Iron: 139 ug/dL (ref 42–145)

## 2024-09-12 LAB — HEMOGLOBIN A1C: Hgb A1c MFr Bld: 6.5 % (ref 4.6–6.5)

## 2024-09-12 LAB — TSH: TSH: 2.29 u[IU]/mL (ref 0.35–5.50)

## 2024-09-12 LAB — FERRITIN: Ferritin: 304.4 ng/mL — ABNORMAL HIGH (ref 10.0–291.0)

## 2024-09-18 ENCOUNTER — Ambulatory Visit: Admitting: Nurse Practitioner

## 2024-09-18 VITALS — BP 126/78 | HR 63 | Temp 98.1°F | Ht 69.0 in | Wt 191.0 lb

## 2024-09-18 DIAGNOSIS — R7989 Other specified abnormal findings of blood chemistry: Secondary | ICD-10-CM | POA: Diagnosis not present

## 2024-09-18 DIAGNOSIS — F332 Major depressive disorder, recurrent severe without psychotic features: Secondary | ICD-10-CM

## 2024-09-18 DIAGNOSIS — E785 Hyperlipidemia, unspecified: Secondary | ICD-10-CM

## 2024-09-18 DIAGNOSIS — R5383 Other fatigue: Secondary | ICD-10-CM

## 2024-09-18 DIAGNOSIS — C7B8 Other secondary neuroendocrine tumors: Secondary | ICD-10-CM

## 2024-09-18 DIAGNOSIS — E663 Overweight: Secondary | ICD-10-CM

## 2024-09-18 DIAGNOSIS — E119 Type 2 diabetes mellitus without complications: Secondary | ICD-10-CM | POA: Insufficient documentation

## 2024-09-18 DIAGNOSIS — E1169 Type 2 diabetes mellitus with other specified complication: Secondary | ICD-10-CM

## 2024-09-18 MED ORDER — SERTRALINE HCL 25 MG PO TABS: ORAL_TABLET | ORAL | 2 refills | Status: DC

## 2024-09-18 NOTE — Assessment & Plan Note (Signed)
 Hyperlipidemia Elevated LDL at 104, increasing cardiovascular risk. ASCVD risk score is 17.4%. Prefers dietary management over medication. - Encouraged dietary modifications focusing on plant-based foods, lean proteins, and low-fat dairy. - Will recheck lipid panel in six weeks to assess response to dietary changes.

## 2024-09-18 NOTE — Progress Notes (Signed)
 Established Patient Office Visit  Subjective   Patient ID: [facility], female    DOB: 1946-09-11  Age: 78 y.o. MRN: [id number]  Chief Complaint  Patient presents with   Medical Management of Chronic Issues    Follow up on labs ( T4)    Discussed the use of AI scribe software for clinical note transcription with the patient, who gave verbal consent to proceed.  History of Present Illness [facility] is a 78 year old female with metastatic malignant neuroendocrine tumor who presents with fatigue and unintentional weight gain.  Fatigue and weight gain - Significant fatigue and unintentional weight gain  Thyroid  dysfunction - Low T4 level, normal TSH - Family history of goiter (mother) - Concern regarding thyroid  impact on weight and overall health - [place]  of thyroid  in 2023 identified multiple thyroid  nodules  Mood disturbance - Depressed mood and lack of motivation for previously enjoyed activities - Fatigue related to ongoing health problems - Maintains daily routine of getting dressed and staying active despite mood symptoms  Dyslipidemia and hyperglycemia - History of elevated cholesterol and blood sugar - Recent laboratory results: LDL 104, A1c 6.5 - Managing cholesterol through diet, focusing on plant-based foods and reducing dairy intake      Review of Systems  Constitutional:  Negative for chills, fever and weight loss (weight gain).  Cardiovascular:  Negative for chest pain and palpitations.      Objective:     BP 126/78   Pulse 63   Temp 98.1 F (36.7 C) (Temporal)   Ht 5' 9 (1.753 m)   Wt 191 lb (86.6 kg)   SpO2 98%   BMI 28.21 kg/m  BP Readings from Last 3 Encounters:  09/18/24 126/78  06/12/24 138/72  03/14/24 126/62   Wt Readings from Last 3 Encounters:  09/18/24 191 lb (86.6 kg)  06/12/24 189 lb 14.4 oz (86.1 kg)  03/14/24 188 lb 4 oz (85.4 kg)      Physical Exam Vitals reviewed.  Constitutional:      General:  She is not in acute distress.    Appearance: Normal appearance.  HENT:     Head: Normocephalic and atraumatic.  Neck:     Vascular: No carotid bruit.  Cardiovascular:     Rate and Rhythm: Normal rate and regular rhythm.     Pulses: Normal pulses.     Heart sounds: Normal heart sounds.  Pulmonary:     Effort: Pulmonary effort is normal.     Breath sounds: Normal breath sounds.  Skin:    General: Skin is warm and dry.  Neurological:     General: No focal deficit present.     Mental Status: She is alert and oriented to person, place, and time.  Psychiatric:        Mood and Affect: Mood normal.        Behavior: Behavior normal.        Judgment: Judgment normal.      No results found for any visits on 09/18/24.    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 [facility])   Relevant Orders   Ambulatory referral to Endocrinology   Lipid panel   Comprehensive metabolic panel with GFR     Other   Hyperlipidemia   Relevant Orders  Lipid panel   Comprehensive metabolic panel with GFR   Severe episode of recurrent major depressive disorder, without psychotic features (HCC)   Relevant Medications   sertraline  (ZOLOFT ) 25 MG tablet   Other Relevant Orders   Lipid panel   Comprehensive metabolic panel with GFR   Overweight   Relevant Orders   Lipid panel   Comprehensive metabolic panel with GFR   Fatigue   Relevant Orders   Lipid panel   Comprehensive metabolic panel with GFR   Other Visit Diagnoses       Low T4    -  Primary   Relevant Orders   US  THYROID    Ambulatory referral to Endocrinology   Lipid panel   Comprehensive metabolic panel with GFR      Assessment and Plan Assessment & Plan Metastatic malignant neuroendocrine tumor with liver metastases Slow-growing tumor primarily affecting the liver with  potential spread to the pancreas. - Continue follow-up with oncology for disease management and monitoring.  Hypothyroidism under evaluation for etiology Low T4 levels with normal TSH. Possible contribution to weight gain and fatigue. Concerns about potential impact of neuroendocrine tumor on thyroid  function. - Ordered thyroid  ultrasound to evaluate current status of thyroid  nodules. - Referred to endocrinologist for further evaluation and management. - Consider starting levothyroxine if ultrasound is stable and thyroid  function remains low.  Depression Likely related to medical condition and possible hypothyroidism. Symptoms include low mood and lack of motivation. - Started sertraline  25 mg daily for one week, then increase to 50 mg daily if tolerated. - Instructed to monitor for side effects, including suicidal ideation. - Scheduled follow-up in six weeks to assess response to medication.  Type 2 diabetes mellitus A1c of 6.5 indicates diabetes. Current A1C is at goal, but monitoring is necessary to prevent progression. - Continue monitoring blood glucose levels and A1c. - Encouraged dietary modifications to maintain blood sugar control.  Hyperlipidemia Elevated LDL at 104, increasing cardiovascular risk. ASCVD risk score is 17.4%. Prefers dietary management over medication. - Encouraged dietary modifications focusing on plant-based foods, lean proteins, and low-fat dairy. - Will recheck lipid panel in six weeks to assess response to dietary changes.  Overweight and fatigue Weight gain contributing to fatigue and joint pain. Possible contribution from hypothyroidism and neuroendocrine tumor. - Encouraged continued physical activity and dietary modifications to support weight loss. - Will monitor weight and symptoms in follow-up visits.   Return in about 6 weeks (around 10/30/2024) for F/U with Mccall Lomax (HLD, DM, weight).    Anna FORBES Pereyra, NP

## 2024-09-18 NOTE — Assessment & Plan Note (Signed)
 Hypothyroidism under evaluation for etiology Low T4 levels with normal TSH. Possible contribution to weight gain and fatigue. Concerns about potential impact of neuroendocrine tumor on thyroid  function. - Ordered thyroid  ultrasound to evaluate current status of thyroid  nodules. - Referred to endocrinologist for further evaluation and management. - Consider starting levothyroxine if ultrasound is stable and thyroid  function remains low.

## 2024-09-18 NOTE — Assessment & Plan Note (Signed)
 Overweight and fatigue Weight gain contributing to fatigue and joint pain. Possible contribution from hypothyroidism and neuroendocrine tumor. - Encouraged continued physical activity and dietary modifications to support weight loss. - Will monitor weight and symptoms in follow-up visits.

## 2024-09-18 NOTE — Assessment & Plan Note (Signed)
 Depression Likely related to medical condition and possible hypothyroidism. Symptoms include low mood and lack of motivation. - Started sertraline  25 mg daily for one week, then increase to 50 mg daily if tolerated. - Instructed to monitor for side effects, including suicidal ideation. - Scheduled follow-up in six weeks to assess response to medication.

## 2024-09-18 NOTE — Assessment & Plan Note (Signed)
 Type 2 diabetes mellitus A1c of 6.5 indicates diabetes. Current A1C is at goal, but monitoring is necessary to prevent progression. - Continue monitoring blood glucose levels and A1c. - Encouraged dietary modifications to maintain blood sugar control.

## 2024-11-07 ENCOUNTER — Other Ambulatory Visit

## 2024-11-07 DIAGNOSIS — E663 Overweight: Secondary | ICD-10-CM | POA: Diagnosis not present

## 2024-11-07 DIAGNOSIS — E785 Hyperlipidemia, unspecified: Secondary | ICD-10-CM

## 2024-11-07 DIAGNOSIS — F332 Major depressive disorder, recurrent severe without psychotic features: Secondary | ICD-10-CM

## 2024-11-07 DIAGNOSIS — R7989 Other specified abnormal findings of blood chemistry: Secondary | ICD-10-CM

## 2024-11-07 DIAGNOSIS — C7B8 Other secondary neuroendocrine tumors: Secondary | ICD-10-CM | POA: Diagnosis not present

## 2024-11-07 DIAGNOSIS — R5383 Other fatigue: Secondary | ICD-10-CM

## 2024-11-07 LAB — COMPREHENSIVE METABOLIC PANEL WITH GFR
ALT: 20 U/L (ref 3–35)
AST: 19 U/L (ref 5–37)
Albumin: 4.1 g/dL (ref 3.5–5.2)
Alkaline Phosphatase: 76 U/L (ref 39–117)
BUN: 21 mg/dL (ref 6–23)
CO2: 27 meq/L (ref 19–32)
Calcium: 8.9 mg/dL (ref 8.4–10.5)
Chloride: 108 meq/L (ref 96–112)
Creatinine, Ser: 0.94 mg/dL (ref 0.40–1.20)
GFR: 58.81 mL/min — ABNORMAL LOW
Glucose, Bld: 112 mg/dL — ABNORMAL HIGH (ref 70–99)
Potassium: 4.1 meq/L (ref 3.5–5.1)
Sodium: 141 meq/L (ref 135–145)
Total Bilirubin: 0.5 mg/dL (ref 0.2–1.2)
Total Protein: 6.6 g/dL (ref 6.0–8.3)

## 2024-11-07 LAB — LIPID PANEL
Cholesterol: 191 mg/dL (ref 28–200)
HDL: 62.7 mg/dL
LDL Cholesterol: 105 mg/dL — ABNORMAL HIGH (ref 10–99)
NonHDL: 128.53
Total CHOL/HDL Ratio: 3
Triglycerides: 117 mg/dL (ref 10.0–149.0)
VLDL: 23.4 mg/dL (ref 0.0–40.0)

## 2024-11-14 ENCOUNTER — Other Ambulatory Visit: Payer: Self-pay | Admitting: Nurse Practitioner

## 2024-11-14 ENCOUNTER — Ambulatory Visit: Admitting: Nurse Practitioner

## 2024-11-14 ENCOUNTER — Encounter: Payer: Self-pay | Admitting: Nurse Practitioner

## 2024-11-14 VITALS — BP 132/68 | HR 61 | Temp 97.6°F | Ht 69.0 in | Wt 190.1 lb

## 2024-11-14 DIAGNOSIS — F332 Major depressive disorder, recurrent severe without psychotic features: Secondary | ICD-10-CM

## 2024-11-14 DIAGNOSIS — R7989 Other specified abnormal findings of blood chemistry: Secondary | ICD-10-CM

## 2024-11-14 DIAGNOSIS — R5383 Other fatigue: Secondary | ICD-10-CM

## 2024-11-14 DIAGNOSIS — C7B8 Other secondary neuroendocrine tumors: Secondary | ICD-10-CM

## 2024-11-14 DIAGNOSIS — F411 Generalized anxiety disorder: Secondary | ICD-10-CM | POA: Diagnosis not present

## 2024-11-14 MED ORDER — SERTRALINE HCL 50 MG PO TABS: 50.0000 mg | ORAL_TABLET | Freq: Every day | ORAL | 1 refills | Status: AC

## 2024-11-14 NOTE — Assessment & Plan Note (Signed)
 Major depressive disorder with anxiety On sertraline  50 mg daily. No suicidal ideation. - Patient would prefer to continue on current dose. Continue sertraline  50 mg daily.

## 2024-11-14 NOTE — Assessment & Plan Note (Signed)
 Subclinical hypothyroidism TSH normal, low T4. Symptoms include cold intolerance, fatigue, weight gain. Referral and ultrasound pending due to insurance. - Reordered thyroid  ultrasound. - Provided endocrinology office phone number for scheduling.

## 2024-11-14 NOTE — Progress Notes (Signed)
 "  Established Patient Office Visit  Subjective   Patient ID: Anna Cortez, female    DOB: 07-08-1946  Age: 79 y.o. MRN: [id number]  Chief Complaint  Patient presents with   Medical Management of Chronic Issues    6 weeks follow up on thyroid  and also right side of the face hurt due to sinus     Discussed the use of AI scribe software for clinical note transcription with the patient, who gave verbal consent to proceed.  History of Present Illness Anna Cortez is a 79 year old female with depression and diabetes who presents for follow-up on her mood and diabetes management.  Depressive symptoms - Started on sertraline  50 mg daily at last visit for depression - Significant fatigue, attributed to cancer - No thoughts of self-harm - Mood and energy slightly improved, but patient reports some anxiousness/difficulty sitting still  Glycemic control - Diabetes with recent hemoglobin A1c of 6.5 - Focusing on lifestyle modifications for blood sugar control  Thyroid  dysfunction - Last TSH 2.29 - Low T4 at 0.5 - Fatigue and cold intolerance at home - Concern about weight gain - Recommended thyroid  ultrasound not completed due to insurance issues  Sinus and otologic symptoms - Right-sided sinus pressure with pain worse to palpation - No tinnitus or hearing loss  Functional status and fatigue - Considering retirement due to fatigue and impact of cancer on ability to work long hours and maintain a healthy lifestyle      ROS: see HPI    Objective:     BP 132/68   Pulse 61   Temp 97.6 F (36.4 C) (Temporal)   Ht 5' 9 (1.753 m)   Wt 190 lb 2 oz (86.2 kg)   SpO2 98%   BMI 28.08 kg/m  BP Readings from Last 3 Encounters:  11/14/24 132/68  09/18/24 126/78  06/12/24 138/72   Wt Readings from Last 3 Encounters:  11/14/24 190 lb 2 oz (86.2 kg)  09/18/24 191 lb (86.6 kg)  06/12/24 189 lb 14.4 oz (86.1 kg)        06/12/2024    8:28 AM 08/09/2023    10:42 AM  PHQ9 SCORE ONLY  PHQ-9 Total Score 0  2      Data saved with a previous flowsheet row definition     Physical Exam Vitals reviewed.  Constitutional:      General: She is not in acute distress.    Appearance: Normal appearance.  HENT:     Head: Normocephalic and atraumatic.  Cardiovascular:     Rate and Rhythm: Normal rate and regular rhythm.     Pulses: Normal pulses.     Heart sounds: Normal heart sounds.  Pulmonary:     Effort: Pulmonary effort is normal.     Breath sounds: Normal breath sounds.  Skin:    General: Skin is warm and dry.  Neurological:     General: No focal deficit present.     Mental Status: She is alert and oriented to person, place, and time.  Psychiatric:        Mood and Affect: Mood normal.        Behavior: Behavior normal.        Judgment: Judgment normal.      No results found for any visits on 11/14/24.    The 10-year ASCVD risk score (Arnett DK, et al., 2019) is: 33.3%    Assessment & Plan:   Problem List Items Addressed This Visit  Other   Anxiety state   Major depressive disorder with anxiety On sertraline  50 mg daily. No suicidal ideation. - Patient would prefer to continue on current dose. Continue sertraline  50 mg daily.      Relevant Medications   sertraline  (ZOLOFT ) 50 MG tablet   MDD (major depressive disorder)   Major depressive disorder with anxiety On sertraline  50 mg daily. No suicidal ideation. - Patient would prefer to continue on current dose. Continue sertraline  50 mg daily.      Relevant Medications   sertraline  (ZOLOFT ) 50 MG tablet   Low T4 - Primary   Subclinical hypothyroidism TSH normal, low T4. Symptoms include cold intolerance, fatigue, weight gain. Referral and ultrasound pending due to insurance. - Reordered thyroid  ultrasound. - Provided endocrinology office phone number for scheduling.      Relevant Orders   US  THYROID    Assessment and Plan Assessment & Plan Major  depressive disorder with anxiety On sertraline  50 mg daily. No suicidal ideation. - Patient would prefer to continue on current dose. Continue sertraline  50 mg daily.  Subclinical hypothyroidism TSH normal, low T4. Symptoms include cold intolerance, fatigue, weight gain. Referral and ultrasound pending due to insurance. - Reordered thyroid  ultrasound. - Provided endocrinology office phone number for scheduling.   Return in about 3 months (around 02/12/2025) for F/U with Jeremie Giangrande.    Lauraine FORBES Pereyra, NP  "

## 2024-11-14 NOTE — Patient Instructions (Signed)
[facility] [phone] 

## 2024-11-28 ENCOUNTER — Other Ambulatory Visit

## 2024-12-05 ENCOUNTER — Inpatient Hospital Stay: Admission: RE | Admit: 2024-12-05

## 2024-12-05 DIAGNOSIS — R7989 Other specified abnormal findings of blood chemistry: Secondary | ICD-10-CM

## 2024-12-11 ENCOUNTER — Other Ambulatory Visit

## 2024-12-11 ENCOUNTER — Ambulatory Visit: Admitting: Hematology

## 2025-02-13 ENCOUNTER — Ambulatory Visit: Admitting: Nurse Practitioner

## 2025-03-05 ENCOUNTER — Ambulatory Visit: Admitting: "Endocrinology
